# Patient Record
Sex: Female | Born: 1940 | Race: White | Hispanic: No | Marital: Married | Smoking: Former smoker
Health system: Southern US, Community
[De-identification: ages and names within clinical notes are randomized; demographics above are authoritative.]

## PROBLEM LIST (undated history)

## (undated) DIAGNOSIS — E039 Hypothyroidism, unspecified: Secondary | ICD-10-CM

## (undated) DIAGNOSIS — K219 Gastro-esophageal reflux disease without esophagitis: Secondary | ICD-10-CM

## (undated) DIAGNOSIS — K589 Irritable bowel syndrome without diarrhea: Secondary | ICD-10-CM

## (undated) DIAGNOSIS — Z9889 Other specified postprocedural states: Secondary | ICD-10-CM

## (undated) DIAGNOSIS — M797 Fibromyalgia: Secondary | ICD-10-CM

## (undated) DIAGNOSIS — G5600 Carpal tunnel syndrome, unspecified upper limb: Secondary | ICD-10-CM

## (undated) DIAGNOSIS — H3530 Unspecified macular degeneration: Secondary | ICD-10-CM

## (undated) DIAGNOSIS — K5790 Diverticulosis of intestine, part unspecified, without perforation or abscess without bleeding: Secondary | ICD-10-CM

## (undated) DIAGNOSIS — R112 Nausea with vomiting, unspecified: Secondary | ICD-10-CM

## (undated) DIAGNOSIS — H00019 Hordeolum externum unspecified eye, unspecified eyelid: Secondary | ICD-10-CM

## (undated) DIAGNOSIS — N6019 Diffuse cystic mastopathy of unspecified breast: Secondary | ICD-10-CM

## (undated) DIAGNOSIS — I10 Essential (primary) hypertension: Secondary | ICD-10-CM

## (undated) DIAGNOSIS — IMO0001 Reserved for inherently not codable concepts without codable children: Secondary | ICD-10-CM

## (undated) DIAGNOSIS — G43909 Migraine, unspecified, not intractable, without status migrainosus: Secondary | ICD-10-CM

## (undated) HISTORY — DX: Reserved for inherently not codable concepts without codable children: IMO0001

## (undated) HISTORY — PX: APPENDECTOMY: SHX54

## (undated) HISTORY — PX: REPLACEMENT TOTAL KNEE: SUR1224

## (undated) HISTORY — DX: Hypothyroidism, unspecified: E03.9

## (undated) HISTORY — DX: Carpal tunnel syndrome, unspecified upper limb: G56.00

## (undated) HISTORY — DX: Fibromyalgia: M79.7

## (undated) HISTORY — PX: OTHER SURGICAL HISTORY: SHX169

## (undated) HISTORY — PX: CHOLECYSTECTOMY: SHX55

## (undated) HISTORY — DX: Essential (primary) hypertension: I10

## (undated) HISTORY — DX: Diverticulosis of intestine, part unspecified, without perforation or abscess without bleeding: K57.90

## (undated) HISTORY — DX: Migraine, unspecified, not intractable, without status migrainosus: G43.909

## (undated) HISTORY — DX: Unspecified macular degeneration: H35.30

## (undated) HISTORY — DX: Irritable bowel syndrome without diarrhea: K58.9

## (undated) HISTORY — DX: Gastro-esophageal reflux disease without esophagitis: K21.9

## (undated) HISTORY — DX: Diffuse cystic mastopathy of unspecified breast: N60.19

---

## 1998-02-05 ENCOUNTER — Ambulatory Visit (HOSPITAL_COMMUNITY): Admission: RE | Admit: 1998-02-05 | Discharge: 1998-02-05 | Payer: Self-pay

## 1998-06-03 ENCOUNTER — Other Ambulatory Visit: Admission: RE | Admit: 1998-06-03 | Discharge: 1998-06-03 | Payer: Self-pay | Admitting: Obstetrics and Gynecology

## 1998-09-29 ENCOUNTER — Ambulatory Visit (HOSPITAL_COMMUNITY): Admission: RE | Admit: 1998-09-29 | Discharge: 1998-09-29 | Payer: Self-pay | Admitting: Gastroenterology

## 1999-12-02 ENCOUNTER — Encounter: Admission: RE | Admit: 1999-12-02 | Discharge: 1999-12-02 | Payer: Self-pay | Admitting: Gastroenterology

## 1999-12-02 ENCOUNTER — Encounter: Payer: Self-pay | Admitting: Gastroenterology

## 2000-11-16 ENCOUNTER — Encounter: Admission: RE | Admit: 2000-11-16 | Discharge: 2000-11-16 | Payer: Self-pay | Admitting: Gastroenterology

## 2000-11-16 ENCOUNTER — Encounter: Payer: Self-pay | Admitting: Gastroenterology

## 2001-11-19 ENCOUNTER — Encounter: Payer: Self-pay | Admitting: Gastroenterology

## 2001-11-19 ENCOUNTER — Encounter: Admission: RE | Admit: 2001-11-19 | Discharge: 2001-11-19 | Payer: Self-pay | Admitting: Gastroenterology

## 2004-01-27 ENCOUNTER — Ambulatory Visit (HOSPITAL_COMMUNITY): Admission: RE | Admit: 2004-01-27 | Discharge: 2004-01-27 | Payer: Self-pay | Admitting: Gastroenterology

## 2006-10-16 ENCOUNTER — Inpatient Hospital Stay (HOSPITAL_COMMUNITY): Admission: RE | Admit: 2006-10-16 | Discharge: 2006-10-18 | Payer: Self-pay | Admitting: Orthopedic Surgery

## 2006-11-23 ENCOUNTER — Emergency Department (HOSPITAL_COMMUNITY): Admission: EM | Admit: 2006-11-23 | Discharge: 2006-11-23 | Payer: Self-pay | Admitting: Emergency Medicine

## 2008-03-13 ENCOUNTER — Encounter: Admission: RE | Admit: 2008-03-13 | Discharge: 2008-03-13 | Payer: Self-pay | Admitting: Gastroenterology

## 2011-02-11 NOTE — Op Note (Signed)
Carol Powers, Carol Powers            ACCOUNT NO.:  [id number]   MEDICAL RECORD NO.:  [id number]          PATIENT TYPE:  INP   LOCATION:  2899                         FACILITY:  [facility]   PHYSICIAN:  Elana Alm. Thurston Hole, M.D. DATE OF BIRTH:  03-Aug-1941   DATE OF PROCEDURE:  10/16/2006  DATE OF DISCHARGE:                               OPERATIVE REPORT   PREOPERATIVE DIAGNOSIS:  Right knee DJD.   POSTOPERATIVE DIAGNOSIS:  Right knee DJD.   PROCEDURE:  Right total knee replacement using DePuy cemented total knee  system with #2.5 cemented femur, #3 cemented tibia with 12.5 mm  polyethylene RP tibial spacer and 32 mm polyethylene cemented patella.   SURGEON:  Elana Alm. Thurston Hole, M.D.   ASSISTANT:  Julien Girt, P.A.   ANESTHESIA:  General.   OPERATIVE TIME:  1 hour 15 minutes.   COMPLICATIONS:  None.   DESCRIPTION OF PROCEDURE:  Carol Powers was brought to operating room  on October 16, 2006 after a femoral nerve block was placed in holding by  anesthesia.  She was placed operative table supine position.  She  received Ancef 1 gram IV preoperatively for prophylaxis.  After being  placed under general anesthesia.  Right knee was examined.  Range of  motion from minus 7-120 degrees with mild varus deformity. Knee stable  ligamentous exam with normal patellar tracking.  She had a Foley  catheter placed under sterile conditions.  Her right leg was then  prepped using sterile DuraPrep and draped using sterile technique.  Leg  was exsanguinated and thigh tourniquet elevated 365 mm.  Initially  through a 15 cm longitudinal incision based over the patella, initial  exposure was made.  The underlying subcutaneous tissues were incised  along with skin incision.  A median arthrotomy was performed revealing  excessive amount of normal-appearing joint fluid.  The articular  surfaces were inspected.  She had grade 4 changes medially, grade 3  changes laterally, and grade 3 and 4 changes on the  patellofemoral  joint.  Osteophytes removed from the femoral condyles and tibial  plateau.  The medial lateral meniscal remnants were removed as well as  the anterior cruciate ligament.  Intramedullary drill was then drilled  up the femoral canal for placement distal femoral cutting jig which was  placed in the appropriate amount of rotation and the distal 12 mm cut  was made.  The distal femur was incised.  2.5 was found to be  appropriate size.  Number 2.5 cutting jig was placed and then these cuts  were made.  The tibial plateau was then exposed, tibial spines were  removed with an oscillating saw.  Intramedullary drill was drilled down  the tibial canal for placement of proximal tibial cutting jig which was  placed in the appropriate amount rotation and proximal 6 mm cut was made  based off the medial or lower side.  Spacer blocks were then placed,  12.5 mm blocks in flexion and extension.  This gave excellent balancing,  excellent stability, and excellent correction of her flexion and varus  deformities.  The #3 tibial base plate trial was placed  on the cut  tibial surface and the keel cut was made.  The number 2.5 distal femoral  PCL box cutter was then placed and these cuts were made.  After this was  done, #3 tibial base plate was placed and with the number 2.5 femoral  trial and a 12.5 mm polyethylene RP tibial spacer, knee was reduced,  taken through range of motion from 0-125 degrees with excellent  stability and excellent correction of her flexion and varus deformities.  The patella was incised, resurfacing 8 mm cut was made and three locking  holes placed for a 32 mm polyethylene patella.  Patellar trial was  placed.  Patellofemoral tracking was evaluated and this was found to be  normal.  At this point it was felt that all components were excellent  size, fit and stability.  They were then removed and the knee was then  jet lavage irrigated with 3 liters of saline.  #3 tibial  baseplate with  cement backing was hammered in position with an excellent fit with  excess cement being removed from around the edges.  Number 2.5 femoral  component with cement backing was hammered into position also with an  excellent fit.  The 12.5 mm polyethylene RP tibial spacer was placed on  tibial baseplate.  The knee reduced, taken through range of motion from  0-125 degrees with excellent stability and excellent correction of her  flexion and varus deformities.  The 32 mm polyethylene cement backed  patella was then placed in its position and held there with a clamp.  After the cement hardened, patellofemoral tracking was again evaluated.  This was found to be normal.  At this point ir was felt that all  components were excellent size, fit and stability.  The wound was  further irrigated.  Tourniquet was released.  Hemostasis obtained with  cautery.  The arthrotomy was closed with #1 Ethibond suture over two  medium Hemovac drains.  Subcutaneous tissues closed with 0 and 2-0  Vicryl, subcuticular layer closed with 4-0 Monocryl.  Sterile dressings  and a long-leg splint applied.  The patient awakened, extubated, taken  to recovery in stable condition.  Needle, sponge counts correct x2 at  end of the case.      Robert A. Thurston Hole, M.D.  Electronically Signed     RAW/MEDQ  D:  10/16/2006  T:  10/16/2006  Job:  [id number]

## 2011-02-11 NOTE — Discharge Summary (Signed)
Carol Powers, Carol Powers            ACCOUNT NO.:  [id number]   MEDICAL RECORD NO.:  [id number]          PATIENT TYPE:  INP   LOCATION:  5012                         FACILITY:  [facility]   PHYSICIAN:  Elana Alm. Thurston Hole, M.D. DATE OF BIRTH:  16-Dec-1940   DATE OF ADMISSION:  10/16/2006  DATE OF DISCHARGE:  10/18/2006                               DISCHARGE SUMMARY   ADMITTING DIAGNOSES:  1. End-stage degenerative joint disease, right knee.  2. Hypertension.  3. Gastroesophageal reflux.  4. Hypothyroidism.   DISCHARGE DIAGNOSES:  1. End-stage degenerative joint disease, right knee.  2. Hypertension.  3. Gastroesophageal reflux.  4. Hypothyroidism.   HISTORY OF PRESENT ILLNESS:  The patient is a 70 year old white female  with history of end-stage DJD of her right knee.  She has failed  conservative care including anti-inflammatories, physical therapy, as  well as debriding arthroscopy.  She understands risks, benefits, and  possible complications of a right total knee replacement and is without  questions.   PROCEDURES IN HOUSE:  On October 16, 2006, the patient underwent a right  total knee replacement by Dr. Thurston Hole and a femoral nerve block by  Anesthesia.   HOSPITAL COURSE:  She was admitted postoperatively for pain control, DVT  prophylaxis, and physical therapy.  Postoperatively in the recovery  room, CPM was placed.  She tolerated CPM 0-30 degrees on the first day.  Postop day #1, hemoglobin was 11.1.  She was afebrile.  She was alert  and oriented x3.  Her drain was removed.  Her chest was clear.  Her  heart had a regular rate and rhythm.  Her lower extremity was  neurovascularly intact with brisk capillary refill and normal motor  function.  Coumadin and Lovenox were used for DVT prophylaxis.  Home  health was ordered.  Discharge planning was set for the next day.  Her  PCA was discontinued, her Foley was discontinued.  She was given a fluid  bolus.  Mepergan Fortis was added  to help with pain control.  Postop day  #2, hemoglobin was 1.1, T-max was 99.3.  She was alert and oriented.  She had no drainage from her wound.  Her calf was soft and nontender.  She tolerated CPM 0-63 degrees.  She had brisk capillary refill.  Her  sensation was intact.  She was given a Dulcolax suppository with good  results.  She was discharged to home in stable condition with TED hose  on both legs, home health PT and nursing, a CPM, a walker, a 3-in-1  commode.  Weightbearing as tolerated, on a regular diet.   DISCHARGE MEDICATIONS:  1. Mepergan fortis 1 tablet every 4 hours as needed for pain.  2. Tylenol 325 mg 2 tablets every 4 hours scheduled.  3. Coumadin 5 mg 1-1/2 tablet daily.  4. Synthroid 0.88 mg daily.  5. Aciphex 20 mg daily.  6. Lotrel 10/20 mg daily.  7. Triamterene/hydrochlorothiazide 37.5/25 mg 1 tablet daily.  8. Nortriptyline 10 mg 3 tablets at night.  9. Catapres weekly.  10.Calcium plus D 2 tablets daily.  11.Colace 100 mg 1 tablet twice a day.  If no BM in 2 days then 60 mL      of milk of magnesia.   She is weightbearing as tolerated.  Will get home health physical  therapy and nursing.  Will see her back in the office on February 4.  She will call us with increased redness, increased pain, increased  drainage, or a temperature greater than 101.      Kirstin Shepperson, P.A.      Robert A. Thurston Hole, M.D.  Electronically Signed    KS/MEDQ  D:  11/29/2006  T:  11/29/2006  Job:  [id number]

## 2011-09-29 DIAGNOSIS — M1710 Unilateral primary osteoarthritis, unspecified knee: Secondary | ICD-10-CM | POA: Diagnosis not present

## 2011-10-12 DIAGNOSIS — M549 Dorsalgia, unspecified: Secondary | ICD-10-CM | POA: Diagnosis not present

## 2011-10-12 DIAGNOSIS — B029 Zoster without complications: Secondary | ICD-10-CM | POA: Diagnosis not present

## 2011-12-14 DIAGNOSIS — Z1231 Encounter for screening mammogram for malignant neoplasm of breast: Secondary | ICD-10-CM | POA: Diagnosis not present

## 2012-01-10 DIAGNOSIS — Z79899 Other long term (current) drug therapy: Secondary | ICD-10-CM | POA: Diagnosis not present

## 2012-01-10 DIAGNOSIS — E039 Hypothyroidism, unspecified: Secondary | ICD-10-CM | POA: Diagnosis not present

## 2012-01-10 DIAGNOSIS — I10 Essential (primary) hypertension: Secondary | ICD-10-CM | POA: Diagnosis not present

## 2012-01-10 DIAGNOSIS — K219 Gastro-esophageal reflux disease without esophagitis: Secondary | ICD-10-CM | POA: Diagnosis not present

## 2012-01-10 DIAGNOSIS — Z1331 Encounter for screening for depression: Secondary | ICD-10-CM | POA: Diagnosis not present

## 2012-01-10 DIAGNOSIS — Z0000 Encounter for general adult medical examination without abnormal findings: Secondary | ICD-10-CM | POA: Diagnosis not present

## 2012-02-13 DIAGNOSIS — R5381 Other malaise: Secondary | ICD-10-CM | POA: Diagnosis not present

## 2012-02-13 DIAGNOSIS — R0789 Other chest pain: Secondary | ICD-10-CM | POA: Diagnosis not present

## 2012-02-13 DIAGNOSIS — R5383 Other fatigue: Secondary | ICD-10-CM | POA: Diagnosis not present

## 2012-03-13 ENCOUNTER — Other Ambulatory Visit: Payer: Self-pay | Admitting: Internal Medicine

## 2012-03-13 DIAGNOSIS — R220 Localized swelling, mass and lump, head: Secondary | ICD-10-CM | POA: Diagnosis not present

## 2012-03-13 DIAGNOSIS — K219 Gastro-esophageal reflux disease without esophagitis: Secondary | ICD-10-CM | POA: Diagnosis not present

## 2012-03-13 DIAGNOSIS — R221 Localized swelling, mass and lump, neck: Secondary | ICD-10-CM | POA: Diagnosis not present

## 2012-03-15 ENCOUNTER — Ambulatory Visit
Admission: RE | Admit: 2012-03-15 | Discharge: 2012-03-15 | Disposition: A | Discharge status: Home / Self Care | Payer: Medicare Other | Source: Ambulatory Visit | Attending: Internal Medicine | Admitting: Internal Medicine

## 2012-03-15 DIAGNOSIS — R221 Localized swelling, mass and lump, neck: Secondary | ICD-10-CM

## 2012-03-15 DIAGNOSIS — K119 Disease of salivary gland, unspecified: Secondary | ICD-10-CM | POA: Diagnosis not present

## 2012-03-15 DIAGNOSIS — M542 Cervicalgia: Secondary | ICD-10-CM | POA: Diagnosis not present

## 2012-03-15 DIAGNOSIS — R220 Localized swelling, mass and lump, head: Secondary | ICD-10-CM | POA: Diagnosis not present

## 2012-03-15 MED ORDER — IOHEXOL 300 MG/ML  SOLN
75.0000 mL | Freq: Once | INTRAMUSCULAR | Status: AC | PRN
  Administered 2012-03-15: 75 mL via INTRAVENOUS

## 2012-05-17 DIAGNOSIS — M25579 Pain in unspecified ankle and joints of unspecified foot: Secondary | ICD-10-CM | POA: Diagnosis not present

## 2012-05-17 DIAGNOSIS — M1710 Unilateral primary osteoarthritis, unspecified knee: Secondary | ICD-10-CM | POA: Diagnosis not present

## 2012-06-26 DIAGNOSIS — Z23 Encounter for immunization: Secondary | ICD-10-CM | POA: Diagnosis not present

## 2012-11-17 DIAGNOSIS — R110 Nausea: Secondary | ICD-10-CM | POA: Diagnosis not present

## 2012-11-17 DIAGNOSIS — R42 Dizziness and giddiness: Secondary | ICD-10-CM | POA: Diagnosis not present

## 2012-11-21 DIAGNOSIS — R42 Dizziness and giddiness: Secondary | ICD-10-CM | POA: Diagnosis not present

## 2012-12-14 DIAGNOSIS — R55 Syncope and collapse: Secondary | ICD-10-CM | POA: Diagnosis not present

## 2012-12-14 DIAGNOSIS — I10 Essential (primary) hypertension: Secondary | ICD-10-CM | POA: Diagnosis not present

## 2012-12-14 DIAGNOSIS — R002 Palpitations: Secondary | ICD-10-CM | POA: Diagnosis not present

## 2012-12-24 DIAGNOSIS — R55 Syncope and collapse: Secondary | ICD-10-CM | POA: Diagnosis not present

## 2012-12-24 DIAGNOSIS — R002 Palpitations: Secondary | ICD-10-CM | POA: Diagnosis not present

## 2013-03-05 DIAGNOSIS — E039 Hypothyroidism, unspecified: Secondary | ICD-10-CM | POA: Diagnosis not present

## 2013-03-05 DIAGNOSIS — Z0000 Encounter for general adult medical examination without abnormal findings: Secondary | ICD-10-CM | POA: Diagnosis not present

## 2013-03-05 DIAGNOSIS — Z1331 Encounter for screening for depression: Secondary | ICD-10-CM | POA: Diagnosis not present

## 2013-03-05 DIAGNOSIS — K219 Gastro-esophageal reflux disease without esophagitis: Secondary | ICD-10-CM | POA: Diagnosis not present

## 2013-03-05 DIAGNOSIS — I10 Essential (primary) hypertension: Secondary | ICD-10-CM | POA: Diagnosis not present

## 2013-03-18 DIAGNOSIS — H903 Sensorineural hearing loss, bilateral: Secondary | ICD-10-CM | POA: Diagnosis not present

## 2013-03-18 DIAGNOSIS — R42 Dizziness and giddiness: Secondary | ICD-10-CM | POA: Diagnosis not present

## 2013-04-22 DIAGNOSIS — R42 Dizziness and giddiness: Secondary | ICD-10-CM | POA: Diagnosis not present

## 2013-04-23 ENCOUNTER — Other Ambulatory Visit: Payer: Self-pay | Admitting: Internal Medicine

## 2013-04-23 DIAGNOSIS — R42 Dizziness and giddiness: Secondary | ICD-10-CM

## 2013-04-24 DIAGNOSIS — R42 Dizziness and giddiness: Secondary | ICD-10-CM | POA: Diagnosis not present

## 2013-04-27 ENCOUNTER — Ambulatory Visit
Admission: RE | Admit: 2013-04-27 | Discharge: 2013-04-27 | Disposition: A | Discharge status: Home / Self Care | Payer: Medicare Other | Source: Ambulatory Visit | Attending: Internal Medicine | Admitting: Internal Medicine

## 2013-04-27 DIAGNOSIS — R42 Dizziness and giddiness: Secondary | ICD-10-CM

## 2013-04-27 DIAGNOSIS — I771 Stricture of artery: Secondary | ICD-10-CM | POA: Diagnosis not present

## 2013-04-27 MED ORDER — GADOBENATE DIMEGLUMINE 529 MG/ML IV SOLN
14.0000 mL | Freq: Once | INTRAVENOUS | Status: AC | PRN
  Administered 2013-04-27: 14 mL via INTRAVENOUS

## 2013-05-14 DIAGNOSIS — I10 Essential (primary) hypertension: Secondary | ICD-10-CM | POA: Diagnosis not present

## 2013-05-14 DIAGNOSIS — R42 Dizziness and giddiness: Secondary | ICD-10-CM | POA: Diagnosis not present

## 2013-05-14 DIAGNOSIS — I679 Cerebrovascular disease, unspecified: Secondary | ICD-10-CM | POA: Diagnosis not present

## 2013-06-04 ENCOUNTER — Ambulatory Visit (INDEPENDENT_AMBULATORY_CARE_PROVIDER_SITE_OTHER): Payer: Medicare Other | Admitting: Neurology

## 2013-06-04 ENCOUNTER — Encounter: Payer: Self-pay | Admitting: Neurology

## 2013-06-04 VITALS — BP 134/78 | HR 99 | Temp 98.9°F | Ht 60.5 in | Wt 155.0 lb

## 2013-06-04 DIAGNOSIS — R42 Dizziness and giddiness: Secondary | ICD-10-CM

## 2013-06-04 DIAGNOSIS — I679 Cerebrovascular disease, unspecified: Secondary | ICD-10-CM

## 2013-06-04 DIAGNOSIS — G250 Essential tremor: Secondary | ICD-10-CM

## 2013-06-04 NOTE — Patient Instructions (Addendum)
I think overall you are doing fairly well but I do want to suggest a few things today:  Remember to drink plenty of fluid, eat healthy meals and do not skip any meals. Try to eat protein with a every meal and eat a healthy snack such as fruit or nuts in between meals. Try to keep a regular sleep-wake schedule and try to exercise daily, particularly in the form of walking, 20-30 minutes a day, if you can.   As far as your medications are concerned, I would like to suggest as needed use of Meclizine.   As far as diagnostic testing: no new test at this time.   Cardiovascular risk factor management is key. Please ask family and friends or your significant other or bed partner if you snore and if so, how loud it is, and if you have breathing related issues in your sleep, such as: snorting sounds, choking sounds, pauses in your breathing or shallow breathing events. These may be symptoms of obstructive sleep apnea (OSA).  Stay well hydrated at all times. Change head position not abruptly.   Consider physical therapy (vestibular rehab) if symptoms seem to get worse, longer or more frequent.

## 2013-06-04 NOTE — Progress Notes (Signed)
Subjective:    Patient ID: Carol Powers is a 72 y.o. female.  HPI  Huston Foley, MD, PhD [facility] [address] [address] [place], [place] [address]  Dear Dr. Earl Gala,  I saw your patient, Carol Powers, upon your kind request in my neurologic clinic today for initial consultation of her intermittent dizziness/vertigo spells. The patient is unaccompanied today. As you know, Carol Powers is a very pleasant 72 year old right-handed woman with an underlying medical history of migraines, reflux disease, hypertension, hypothyroidism, irritable bowel syndrome, fibromyalgia, carpal tunnel syndrome, diverticulosis, and arthritis, who has had about 5 or 6 spells of dizziness since last year. She reports a sudden onset of spinning sensation and no warning signs. Her vertigo symptoms may last up to 2 hours but are typically in the 15-30 minute range. She had an MRI and MRA of the brain and MRA of the neck. I reviewed her MRI reports. Her neck MRA showed mild tortuosity of the cervical ICA bilaterally. No significant stenosis was seen. 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 She quit smoking years ago, she drinks EtOH very rarely. Her father had a MI. A MA has vertigo at times, but she is in her 33s. Her mother had a head tremor and died at 17 from kidney cancer. There is cancer on the maternal side. She has 2 daughters, both have migraines. She has a personal hx of migraines, which improved post-menopause. Never had TIA or stroke symptoms, denying sudden onset of one sided weakness, numbness, tingling, slurring of speech or droopy face, hearing loss, diplopia or visual field cut or monocular loss of vision, and denies recurrent headaches. She has a Hx of tinnitus, but this is mild and not new. She has been on ASA 81 mg for the last month.  She reports occasional snoring per husband's feedback. He has never reported any apneas to her. She does not wake up gasping or choking. She was given meclizine when she went to the emergency room one time. She felt it was somewhat helpful.  Her Past Medical History Is Significant For: Past Medical History  Diagnosis Date  . Migraine   . Reflux   . HTN (hypertension)   . Hypothyroidism   . IBS (irritable bowel syndrome)   . Fibromyalgia   . Carpal tunnel syndrome   . Diverticulosis   . Fibrocystic breast disease     Her Past Surgical History Is Significant For: Past Surgical History  Procedure Laterality Date  . Total knee arthroplasty Left   . Replacement total knee Right     Her Family History Is Significant For: Family History  Problem Relation Age of Onset  . Cancer Mother   . Heart failure Father     Her Social History Is Significant For: History   Social History  . Marital Status: Married    Spouse Name: N/A    Number of Children: N/A  . Years of Education: N/A   Social History Main Topics  . Smoking status: Former Smoker    Types: Cigarettes    Quit date: 06/05/1967  . Smokeless tobacco: None  . Alcohol Use: 1.0 oz/week    2 drink(s) per week  . Drug Use: No  . Sexual Activity: None   Other Topics Concern  . None   Social History Narrative  . None    Her  Allergies Are:  Allergies  Allergen Reactions  . Beta Adrenergic Blockers Other (See Comments)    Nightmares  . Codeine   . Dexilant [Dexlansoprazole]   . Erythromycin   . Septra [Sulfamethoxazole W-Trimethoprim]   :   Her Current Medications Are:  Outpatient Encounter Prescriptions as of 06/04/2013  Medication Sig Dispense Refill  . amLODipine (NORVASC) 10 MG tablet Take 10 mg by mouth daily.      Marland Kitchen aspirin 81 MG tablet Take 81 mg by mouth daily.      . benazepril (LOTENSIN) 40 MG tablet Take 40 mg by mouth daily.      . Calcium Carbonate-Vitamin D (CALCIUM-VITAMIN D) 500-200 MG-UNIT per  tablet Take 1 tablet by mouth 2 (two) times daily with a meal.      . esomeprazole (NEXIUM) 40 MG capsule Take 40 mg by mouth daily before breakfast.      . fish oil-omega-3 fatty acids 1000 MG capsule Take 2 g by mouth daily.      Marland Kitchen levothyroxine (SYNTHROID, LEVOTHROID) 88 MCG tablet Take 88 mcg by mouth daily before breakfast.      . loratadine (CLARITIN) 10 MG tablet Take 10 mg by mouth daily.      . nortriptyline (PAMELOR) 10 MG capsule       . triamterene-hydrochlorothiazide (MAXZIDE-25) 37.5-25 MG per tablet Take 1 tablet by mouth every morning.      . vitamin C (ASCORBIC ACID) 500 MG tablet Take 500 mg by mouth daily.       No facility-administered encounter medications on file as of 06/04/2013.   Review of Systems  HENT: Positive for tinnitus.   Musculoskeletal: Positive for arthralgias.  Neurological: Positive for dizziness.    Objective:  Neurologic Exam  Physical Exam Physical Examination:   Filed Vitals:   06/04/13 0833  BP: 134/78  Pulse: 99  Temp: 98.9 F (37.2 C)    General Examination: The patient is a very pleasant 72 y.o. female in no acute distress. She appears well-developed and well-nourished and well groomed. She is overweight.  HEENT: Normocephalic, atraumatic, pupils are equal, round and reactive to light and accommodation. Extraocular tracking is good without limitation to gaze excursion or nystagmus noted. Normal smooth pursuit is noted. Hearing is grossly intact. Tympanic membranes are clear bilaterally. Face is symmetric with normal facial animation and normal facial sensation. Speech is clear with no dysarthria noted. There is no hypophonia. There is a very mild intermittent yes-yes type head tremor. Neck is supple with full range of passive and active motion. There are no carotid bruits on auscultation. Oropharynx exam reveals: mild mouth dryness, adequate dental hygiene and mild airway crowding, due to redundant soft palated. Mallampati is class II.  Tongue protrudes centrally and palate elevates symmetrically. She has no sudden onset of lightheadedness or vertigo upon standing. She has no vertiginous symptoms or nystagmus upon sudden changes of her head position when moved actively or passively.  Chest: Clear to auscultation without wheezing, rhonchi or crackles noted.  Heart: S1+S2+0, regular and normal without murmurs, rubs or gallops noted.   Abdomen: Soft, non-tender and non-distended with normal bowel sounds appreciated on auscultation.  Extremities: There is no pitting edema in the distal lower extremities bilaterally. Pedal pulses are intact.  Skin: Warm and dry without trophic changes noted. There are no varicose veins.  Musculoskeletal: exam reveals no obvious joint deformities, tenderness or joint swelling or erythema.   Neurologically:  Mental status: The patient is awake, alert and oriented in  all 4 spheres. Her memory, attention, language and knowledge are appropriate. There is no aphasia, agnosia, apraxia or anomia. Speech is clear with normal prosody and enunciation. Thought process is linear. Mood is congruent and affect is normal.  Cranial nerves are as described above under HEENT exam. In addition, shoulder shrug is normal with equal shoulder height noted. Motor exam: Normal bulk, strength and tone is noted. There is no drift, tremor or rebound. Romberg is negative. Reflexes are 2+ throughout. Toes are downgoing bilaterally. Fine motor skills are intact with normal finger taps, normal hand movements, normal rapid alternating patting, normal foot taps and normal foot agility.  Cerebellar testing shows no dysmetria or intention tremor on finger to nose testing. Heel to shin is unremarkable bilaterally. There is no truncal or gait ataxia.  Sensory exam is intact to light touch, pinprick, vibration, temperature sense and proprioception in the upper and lower extremities.  Gait, station and balance are unremarkable. No veering  to one side is noted. No leaning to one side is noted. Posture is age-appropriate and stance is narrow based. No problems turning are noted. She turns en bloc. Tandem walk is unremarkable. Intact toe and heel stance is noted.               Assessment and Plan:   Assessment and Plan:  In summary, Carol Powers is a very pleasant 72 y.o.-year old female with a history of paroxysmal vertigo, infrequent and usually brief. I reviewed her recent MRI and MRA reports with her. 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 Her symptoms and her physical exam are more in keeping with benign paroxysmal positional vertigo. I talked to her at length about this. I also reassured her that her physical exam is nonfocal today. If her symptoms were to come more frequently or last longer she may be a good candidate for physical therapy for vestibular rehabilitation. At this time she is advised that cardiovascular risk factor modification and management are probably the most important factors. This means blood pressure management, thyroid function test, screening for diabetes, cholesterol management and screening for obstructive sleep apnea and weight management. She's also advised that branch intracranial blood vessel stenosis is not reversible. I would agree with the baby aspirin and would like for her to continue with that. She can also continue to use meclizine as needed. She is advised to stay well-hydrated. She is advised to try to change positions not abruptly. She's also advised that paroxysmal vertigo can come back at any time. At this juncture I will not add any more testing. I do not see any recent laboratory data other than a BMP in your records. She indicates that she recently had blood work through your office. I suggested that I see her back on an as-needed basis. 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 She has a family history of essential tremor in her mother.  Thank you very much for allowing me to participate in the care of this nice patient. If I can be of any further assistance to you please do not hesitate to call me at [phone].  Sincerely,   Huston Foley, MD, PhD

## 2013-06-27 DIAGNOSIS — H18419 Arcus senilis, unspecified eye: Secondary | ICD-10-CM | POA: Diagnosis not present

## 2013-06-27 DIAGNOSIS — H5231 Anisometropia: Secondary | ICD-10-CM | POA: Diagnosis not present

## 2013-06-27 DIAGNOSIS — H04129 Dry eye syndrome of unspecified lacrimal gland: Secondary | ICD-10-CM | POA: Diagnosis not present

## 2013-06-27 DIAGNOSIS — H353190 Nonexudative age-related macular degeneration, unspecified eye, stage unspecified: Secondary | ICD-10-CM | POA: Diagnosis not present

## 2013-07-10 DIAGNOSIS — Z23 Encounter for immunization: Secondary | ICD-10-CM | POA: Diagnosis not present

## 2013-08-01 DIAGNOSIS — Z1231 Encounter for screening mammogram for malignant neoplasm of breast: Secondary | ICD-10-CM | POA: Diagnosis not present

## 2013-08-20 DIAGNOSIS — N390 Urinary tract infection, site not specified: Secondary | ICD-10-CM | POA: Diagnosis not present

## 2014-02-05 DIAGNOSIS — R198 Other specified symptoms and signs involving the digestive system and abdomen: Secondary | ICD-10-CM | POA: Diagnosis not present

## 2014-02-05 DIAGNOSIS — K921 Melena: Secondary | ICD-10-CM | POA: Diagnosis not present

## 2014-02-05 DIAGNOSIS — M545 Low back pain, unspecified: Secondary | ICD-10-CM | POA: Diagnosis not present

## 2014-02-19 IMAGING — CT CT NECK W/ CM
4 of 5 series · 16 of 33 positions shown, 19 images · IV contrast (75CC OMNI 300)
Comparison: Chest CT 03/13/2008 and earlier.

CLINICAL DATA: 70-year-old female with right neck pain.  History of
benign parotid mass resected in 9445, right thyroidectomy for
benign disease in 8990.

CT NECK WITH CONTRAST
TECHNIQUE: Multidetector CT imaging of the neck was performed with
intravenous contrast.
Contrast: 75mL OMNIPAQUE IOHEXOL 300 MG/ML  SOLN

[Series 4: axial neck · axial · 0.39mm/px · z∈[-213,-116]mm · 3 of 99 slices shown]
[im 20/99  bone]
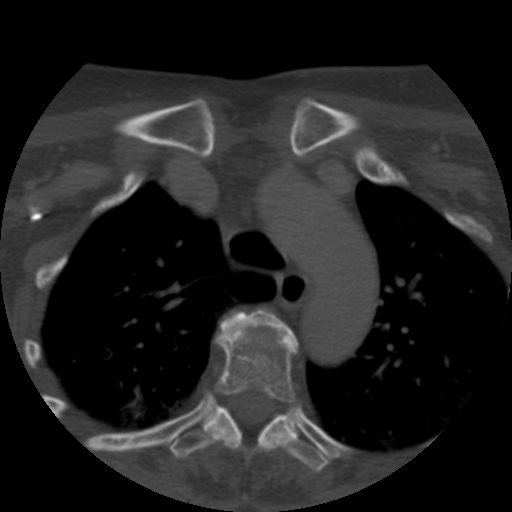
[im 40/99  bone]
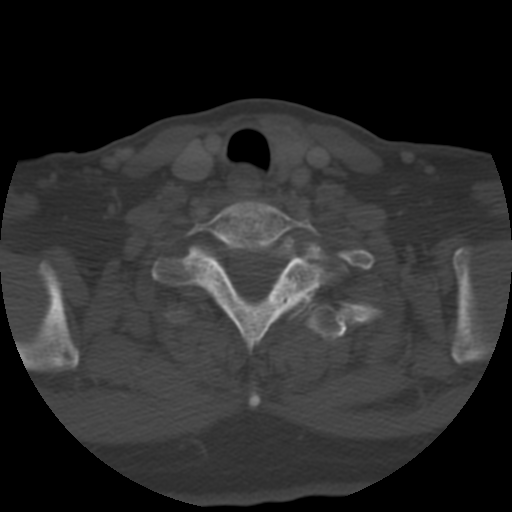
[im 59/99  bone]
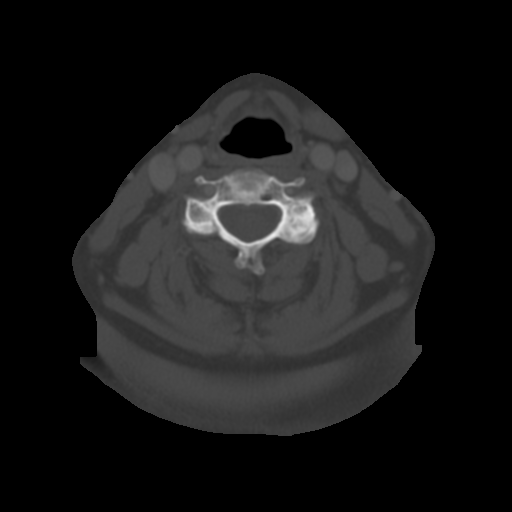

[Series 300: cor · coronal · 0.49mm/px · 3 of 100 slices shown]
[im 20/100  bone]
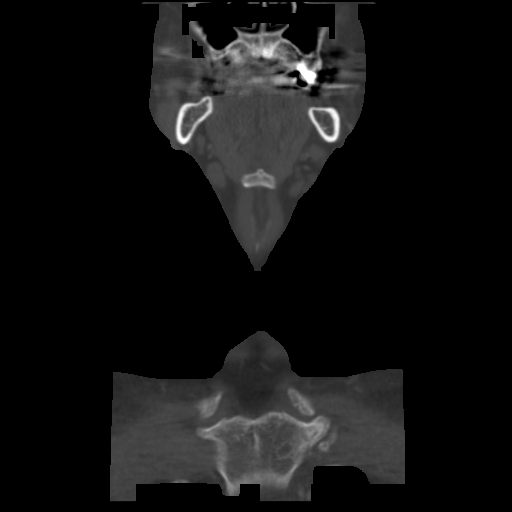
[im 40/100  bone]
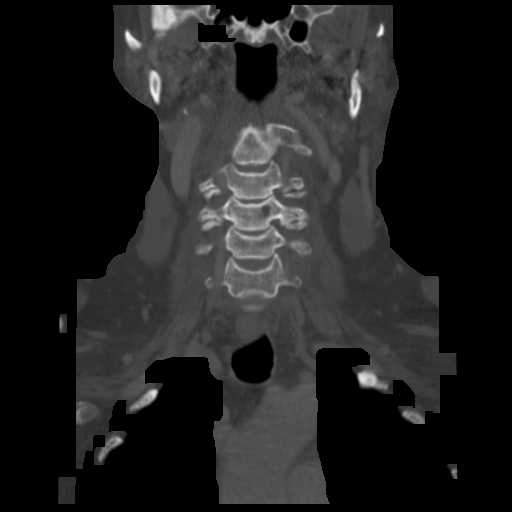
[im 60/100  bone]
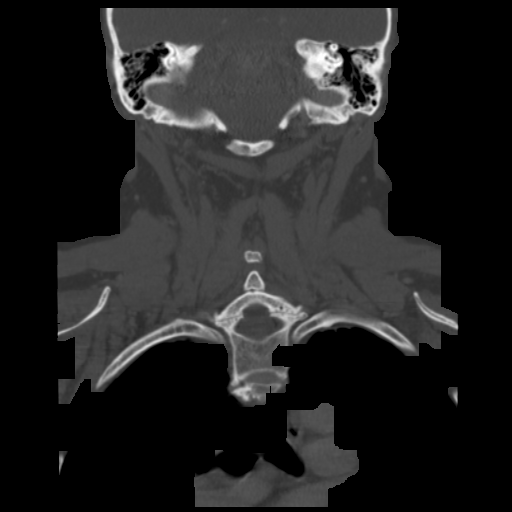

[Series 301: sag · sagittal · 0.49mm/px · 5 of 100 slices shown, 6 images]
[im 34/100  bone]
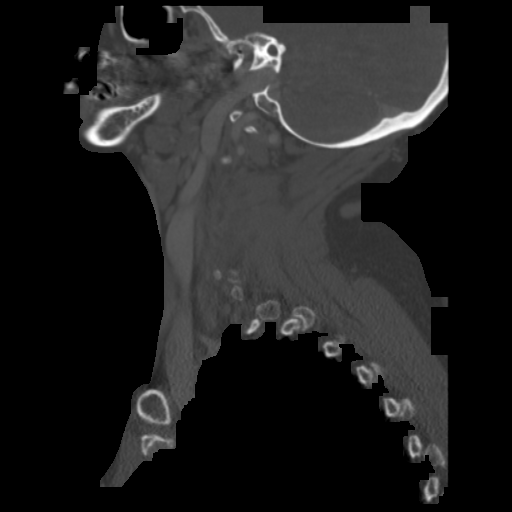
[im 42/100  bone]
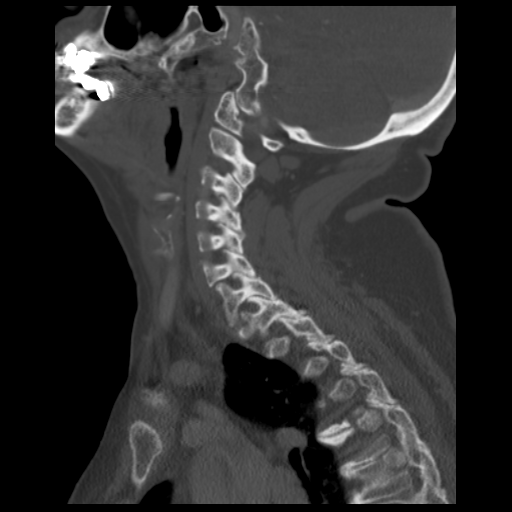
[im 50/100  soft-tissue]
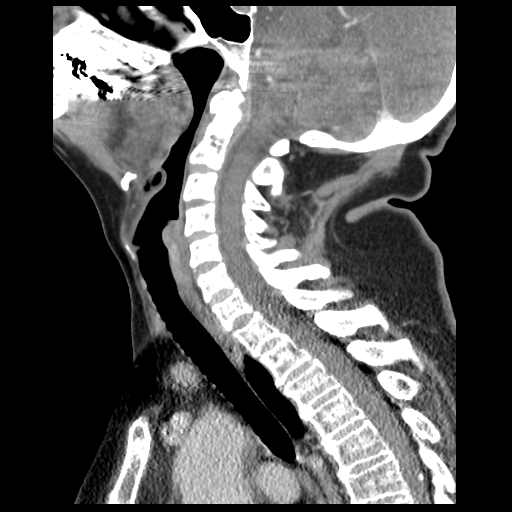
[im 50/100  bone]
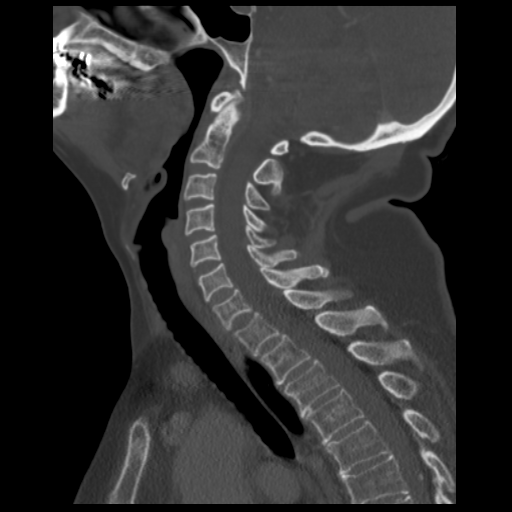
[im 58/100  bone]
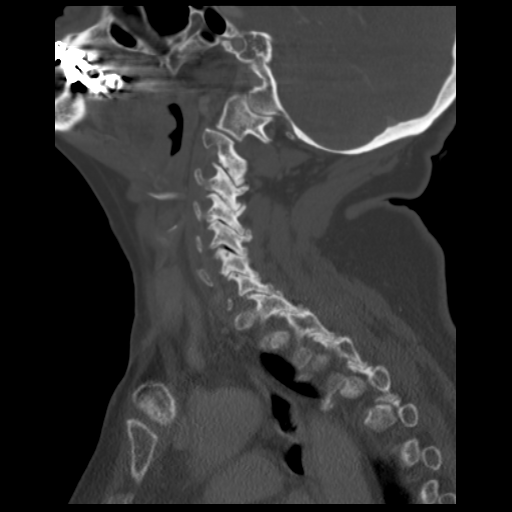
[im 67/100  bone]
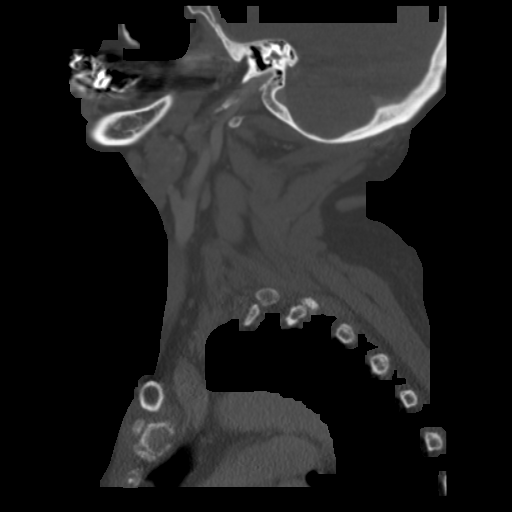

[Series 302: axial · axial · 0.39mm/px · z∈[-231,-64]mm · 5 of 126 slices shown, 7 images]
[im 21/126  soft-tissue]
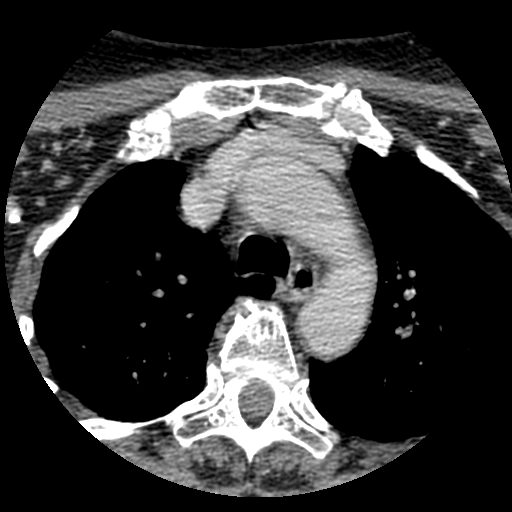
[im 21/126  bone]
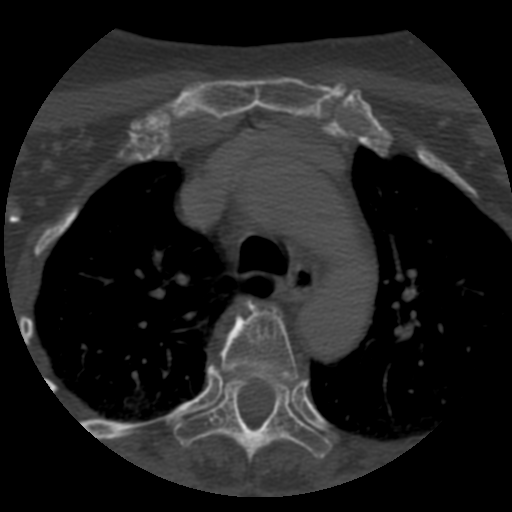
[im 42/126  bone]
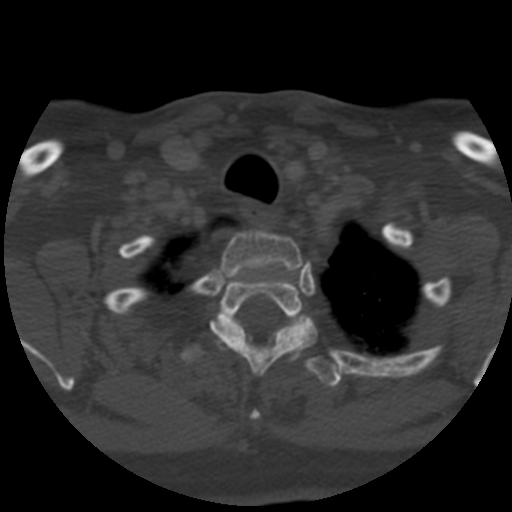
[im 63/126  bone]
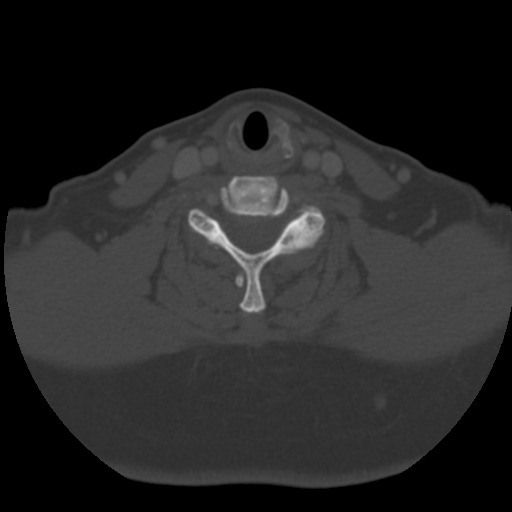
[im 84/126  bone]
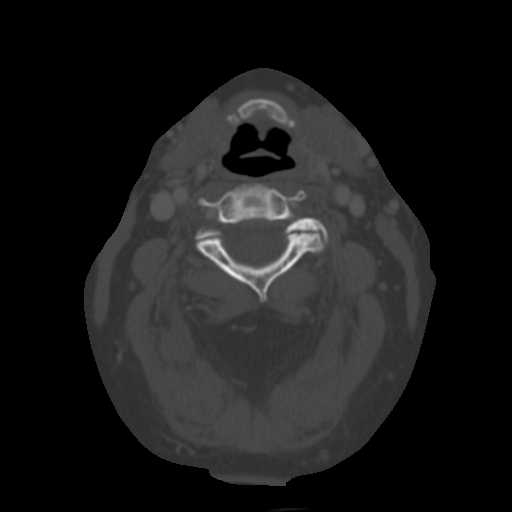
[im 105/126  soft-tissue]
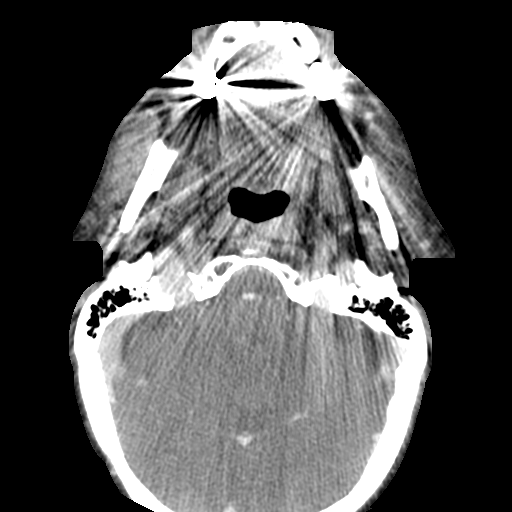
[im 105/126  bone]
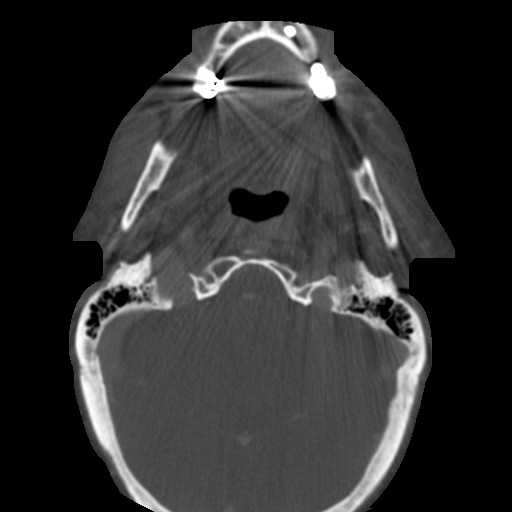

[16 of 33 positions shown; findings below may reference images not displayed]

FINDINGS: Atelectasis in the lung apices.  Stable and negative
visualized superior mediastinum.

The right thyroid lobe is surgically absent.  The left lobe is
within normal limits.  Negative larynx.  Pharyngeal mucosal
surfaces are within normal limits.  Parapharyngeal and
retropharyngeal spaces are within normal limits.  Sublingual space
within normal limits.

The area of the palpable abnormality is marked at the right neck on
series 4 image 28 corresponding to the right parotidectomy surgical
bed posterior to the angle of the mandible.  No underlying mass or
lymphadenopathy.  Submandibular glands are within normal limits.
No abnormal cervical lymph nodes are identified.

Major vascular structures in the neck are patent including the
right internal jugular vein.  Grossly negative visualized brain
parenchyma.

Visualized paranasal sinuses and mastoids are clear.  Facet
degeneration in the cervical spine and upper thoracic spine. No
acute osseous abnormality identified.   There is a small chronic
punctate calcification along the posterior thecal sac at the T6-T7
level, unchanged since 11/23/2006.
IMPRESSION: Postoperative changes from resection of the right parotid and
thyroid lobe.  No mass, lymphadenopathy, or acute findings in the
neck.

## 2014-02-25 ENCOUNTER — Telehealth: Payer: Self-pay | Admitting: Hematology & Oncology

## 2014-02-25 NOTE — Telephone Encounter (Signed)
na

## 2014-03-03 ENCOUNTER — Other Ambulatory Visit: Payer: Self-pay | Admitting: Gastroenterology

## 2014-03-03 DIAGNOSIS — K921 Melena: Secondary | ICD-10-CM | POA: Diagnosis not present

## 2014-03-11 DIAGNOSIS — Z23 Encounter for immunization: Secondary | ICD-10-CM | POA: Diagnosis not present

## 2014-03-11 DIAGNOSIS — E782 Mixed hyperlipidemia: Secondary | ICD-10-CM | POA: Diagnosis not present

## 2014-03-11 DIAGNOSIS — Z0000 Encounter for general adult medical examination without abnormal findings: Secondary | ICD-10-CM | POA: Diagnosis not present

## 2014-03-11 DIAGNOSIS — E039 Hypothyroidism, unspecified: Secondary | ICD-10-CM | POA: Diagnosis not present

## 2014-03-11 DIAGNOSIS — I10 Essential (primary) hypertension: Secondary | ICD-10-CM | POA: Diagnosis not present

## 2014-03-11 DIAGNOSIS — K219 Gastro-esophageal reflux disease without esophagitis: Secondary | ICD-10-CM | POA: Diagnosis not present

## 2014-03-19 DIAGNOSIS — L819 Disorder of pigmentation, unspecified: Secondary | ICD-10-CM | POA: Diagnosis not present

## 2014-03-19 DIAGNOSIS — D1801 Hemangioma of skin and subcutaneous tissue: Secondary | ICD-10-CM | POA: Diagnosis not present

## 2014-03-19 DIAGNOSIS — D239 Other benign neoplasm of skin, unspecified: Secondary | ICD-10-CM | POA: Diagnosis not present

## 2014-03-19 DIAGNOSIS — L723 Sebaceous cyst: Secondary | ICD-10-CM | POA: Diagnosis not present

## 2014-03-19 DIAGNOSIS — L739 Follicular disorder, unspecified: Secondary | ICD-10-CM | POA: Diagnosis not present

## 2014-03-19 DIAGNOSIS — L821 Other seborrheic keratosis: Secondary | ICD-10-CM | POA: Diagnosis not present

## 2014-04-21 ENCOUNTER — Encounter (HOSPITAL_COMMUNITY): Payer: Self-pay | Admitting: Pharmacy Technician

## 2014-04-30 ENCOUNTER — Encounter (HOSPITAL_COMMUNITY): Payer: Self-pay | Admitting: *Deleted

## 2014-05-01 DIAGNOSIS — H353190 Nonexudative age-related macular degeneration, unspecified eye, stage unspecified: Secondary | ICD-10-CM | POA: Diagnosis not present

## 2014-05-01 DIAGNOSIS — H40019 Open angle with borderline findings, low risk, unspecified eye: Secondary | ICD-10-CM | POA: Diagnosis not present

## 2014-05-01 DIAGNOSIS — H04129 Dry eye syndrome of unspecified lacrimal gland: Secondary | ICD-10-CM | POA: Diagnosis not present

## 2014-05-01 DIAGNOSIS — H00019 Hordeolum externum unspecified eye, unspecified eyelid: Secondary | ICD-10-CM | POA: Diagnosis not present

## 2014-05-13 ENCOUNTER — Ambulatory Visit (HOSPITAL_COMMUNITY): Payer: Medicare Other | Admitting: Anesthesiology

## 2014-05-13 ENCOUNTER — Ambulatory Visit (HOSPITAL_COMMUNITY)
Admission: RE | Admit: 2014-05-13 | Discharge: 2014-05-13 | Disposition: A | Discharge status: Home / Self Care | Payer: Medicare Other | Source: Ambulatory Visit | Attending: Gastroenterology | Admitting: Gastroenterology

## 2014-05-13 ENCOUNTER — Encounter (HOSPITAL_COMMUNITY): Payer: Medicare Other | Admitting: Anesthesiology

## 2014-05-13 ENCOUNTER — Encounter (HOSPITAL_COMMUNITY): Payer: Self-pay | Admitting: *Deleted

## 2014-05-13 ENCOUNTER — Encounter (HOSPITAL_COMMUNITY)
Admission: RE | Disposition: A | Discharge status: Home / Self Care | Payer: Self-pay | Source: Ambulatory Visit | Attending: Gastroenterology

## 2014-05-13 DIAGNOSIS — Z87891 Personal history of nicotine dependence: Secondary | ICD-10-CM | POA: Diagnosis not present

## 2014-05-13 DIAGNOSIS — E039 Hypothyroidism, unspecified: Secondary | ICD-10-CM | POA: Diagnosis not present

## 2014-05-13 DIAGNOSIS — Z79899 Other long term (current) drug therapy: Secondary | ICD-10-CM | POA: Diagnosis not present

## 2014-05-13 DIAGNOSIS — Z96659 Presence of unspecified artificial knee joint: Secondary | ICD-10-CM | POA: Diagnosis not present

## 2014-05-13 DIAGNOSIS — IMO0001 Reserved for inherently not codable concepts without codable children: Secondary | ICD-10-CM | POA: Diagnosis not present

## 2014-05-13 DIAGNOSIS — K921 Melena: Secondary | ICD-10-CM | POA: Insufficient documentation

## 2014-05-13 DIAGNOSIS — Z7982 Long term (current) use of aspirin: Secondary | ICD-10-CM | POA: Diagnosis not present

## 2014-05-13 DIAGNOSIS — K589 Irritable bowel syndrome without diarrhea: Secondary | ICD-10-CM | POA: Diagnosis not present

## 2014-05-13 DIAGNOSIS — I10 Essential (primary) hypertension: Secondary | ICD-10-CM | POA: Diagnosis not present

## 2014-05-13 HISTORY — DX: Hordeolum externum unspecified eye, unspecified eyelid: H00.019

## 2014-05-13 HISTORY — DX: Other specified postprocedural states: Z98.890

## 2014-05-13 HISTORY — PX: COLONOSCOPY WITH PROPOFOL: SHX5780

## 2014-05-13 HISTORY — DX: Other specified postprocedural states: R11.2

## 2014-05-13 SURGERY — COLONOSCOPY WITH PROPOFOL
Anesthesia: Monitor Anesthesia Care

## 2014-05-13 MED ORDER — LACTATED RINGERS IV SOLN
INTRAVENOUS | Status: DC
  Administered 2014-05-13: 11:00:00 via INTRAVENOUS

## 2014-05-13 MED ORDER — LIDOCAINE HCL (CARDIAC) 20 MG/ML IV SOLN
INTRAVENOUS | Status: AC
  Filled 2014-05-13: qty 5

## 2014-05-13 MED ORDER — PROPOFOL 10 MG/ML IV BOLUS
INTRAVENOUS | Status: DC | PRN
  Administered 2014-05-13 (×4): 20 mg via INTRAVENOUS

## 2014-05-13 MED ORDER — PROPOFOL INFUSION 10 MG/ML OPTIME
INTRAVENOUS | Status: DC | PRN
  Administered 2014-05-13: 120 ug/kg/min via INTRAVENOUS

## 2014-05-13 MED ORDER — LIDOCAINE HCL (CARDIAC) 20 MG/ML IV SOLN
INTRAVENOUS | Status: DC | PRN
  Administered 2014-05-13: 100 mg via INTRAVENOUS

## 2014-05-13 MED ORDER — PROPOFOL 10 MG/ML IV BOLUS
INTRAVENOUS | Status: AC
  Filled 2014-05-13: qty 20

## 2014-05-13 SURGICAL SUPPLY — 22 items

## 2014-05-13 NOTE — H&P (Signed)
  Problem: Intermittent hematochezia. Normal screening colonoscopy performed on 12/03/2008  History: The patient is a 73 year old female born in November 16, 1940. She underwent a normal screening colonoscopy in March 2010. Intermittently, she sees fresh blood on the toilet tissue after a bowel movement.  Her mother was diagnosed with kidney cancer. A maternal aunt was diagnosed with liver cancer. Her maternal grandmother was diagnosed with colon cancer. Her maternal grandfather was diagnosed with kidney cancer. Her brother and her 2 daughters have not been diagnosed with cancer.  The patient is scheduled to undergo a diagnostic colonoscopy.  Past medical history: Chronic headaches. Gastroesophageal reflux. Hypertension. Hypothyroidism. Thyroid nodule removed surgically. Fibromyalgia syndrome. Irritable bowel syndrome. Carpal tunnel syndrome. Colonic diverticulosis. Osteoarthritis. Shingles diagnosed in 2012. Tonsillectomy. Appendectomy. Right oophorectomy. Breast biopsy. Arthroscopic surgeries on both knees. Laparoscopic cholecystectomy. Right knee replacement surgery. Benign parotid gland removed surgically  Medication allergies: Beta blockers caused nightmares. Erythromycin. Septra. Codeine.   Exam: The patient is alert and lying comfortably on the endoscopy stretcher. Lungs are clear to auscultation. Cardiac exam reveals a regular rhythm. Abdomen is soft nontender to palpation.  Plan: Proceed with diagnostic colonoscopy to evaluate intermittent small-volume hematochezia.

## 2014-05-13 NOTE — Discharge Instructions (Signed)

## 2014-05-13 NOTE — Op Note (Signed)
Problem: Intermittent small-volume hematochezia. Normal screening colonoscopy performed on 12/03/2008  Endoscopist: Earle Gell  Premedication: Propofol administered by anesthesia  Procedure: Diagnostic colonoscopy The patient was placed in the left lateral decubitus position. Anal inspection and digital rectal exam were normal. The Pentax pediatric colonoscope was introduced into the rectum and advanced to the cecum. A normal-appearing ileocecal valve and appendiceal orifice were identified. Colonic preparation for the exam today was good.  Rectum. Normal. Retroflexed view of the distal rectum normal  Sigmoid colon and descending colon. Normal  Splenic flexure. Normal  Transverse colon. Normal  Hepatic flexure. Normal  Ascending colon. Normal  Cecum and ileocecal valve. Normal  Assessment: Normal diagnostic colonoscopy

## 2014-05-13 NOTE — Anesthesia Postprocedure Evaluation (Signed)
  Anesthesia Post-op Note  Patient: Carol Powers  Procedure(s) Performed: Procedure(s) (LRB): COLONOSCOPY WITH PROPOFOL (N/A)  Patient Location: PACU  Anesthesia Type: MAC  Level of Consciousness: awake and alert   Airway and Oxygen Therapy: Patient Spontanous Breathing  Post-op Pain: mild  Post-op Assessment: Post-op Vital signs reviewed, Patient's Cardiovascular Status Stable, Respiratory Function Stable, Patent Airway and No signs of Nausea or vomiting  Last Vitals:  Filed Vitals:   05/13/14 1155  BP: 95/40  Temp: 36.7 C  Resp: 15    Post-op Vital Signs: stable   Complications: No apparent anesthesia complications

## 2014-05-13 NOTE — Anesthesia Preprocedure Evaluation (Addendum)
Anesthesia Evaluation  Patient identified by MRN, date of birth, ID band Patient awake    Reviewed: Allergy & Precautions, H&P , NPO status , Patient's Chart, lab work & pertinent test results  History of Anesthesia Complications (+) PONV  Airway Mallampati: II TM Distance: >3 FB Neck ROM: Full    Dental no notable dental hx.    Pulmonary neg pulmonary ROS, former smoker,  breath sounds clear to auscultation  Pulmonary exam normal       Cardiovascular hypertension, Pt. on medications Rhythm:Regular Rate:Normal     Neuro/Psych negative neurological ROS  negative psych ROS   GI/Hepatic negative GI ROS, Neg liver ROS,   Endo/Other  Hypothyroidism   Renal/GU negative Renal ROS  negative genitourinary   Musculoskeletal negative musculoskeletal ROS (+)   Abdominal   Peds negative pediatric ROS (+)  Hematology negative hematology ROS (+)   Anesthesia Other Findings   Reproductive/Obstetrics negative OB ROS                          Anesthesia Physical Anesthesia Plan  ASA: II  Anesthesia Plan: MAC   Post-op Pain Management:    Induction: Intravenous  Airway Management Planned: Nasal Cannula and Simple Face Mask  Additional Equipment:   Intra-op Plan:   Post-operative Plan:   Informed Consent: I have reviewed the patients History and Physical, chart, labs and discussed the procedure including the risks, benefits and alternatives for the proposed anesthesia with the patient or authorized representative who has indicated his/her understanding and acceptance.   Dental advisory given  Plan Discussed with: CRNA and Surgeon  Anesthesia Plan Comments:        Anesthesia Quick Evaluation

## 2014-05-13 NOTE — Transfer of Care (Signed)
Immediate Anesthesia Transfer of Care Note  Patient: Carol Powers  Procedure(s) Performed: Procedure(s): COLONOSCOPY WITH PROPOFOL (N/A)  Patient Location: PACU and Endoscopy Unit  Anesthesia Type:MAC  Level of Consciousness: awake, alert , oriented and patient cooperative  Airway & Oxygen Therapy: Patient Spontanous Breathing and Patient connected to face mask oxygen  Post-op Assessment: Report given to PACU RN, Post -op Vital signs reviewed and stable and Patient moving all extremities  Post vital signs: Reviewed and stable  Complications: No apparent anesthesia complications

## 2014-05-16 ENCOUNTER — Encounter (HOSPITAL_COMMUNITY): Payer: Self-pay | Admitting: Gastroenterology

## 2014-06-12 DIAGNOSIS — G5600 Carpal tunnel syndrome, unspecified upper limb: Secondary | ICD-10-CM | POA: Diagnosis not present

## 2014-06-15 DIAGNOSIS — N390 Urinary tract infection, site not specified: Secondary | ICD-10-CM | POA: Diagnosis not present

## 2014-06-15 DIAGNOSIS — R300 Dysuria: Secondary | ICD-10-CM | POA: Diagnosis not present

## 2014-06-22 DIAGNOSIS — R350 Frequency of micturition: Secondary | ICD-10-CM | POA: Diagnosis not present

## 2014-06-22 DIAGNOSIS — N390 Urinary tract infection, site not specified: Secondary | ICD-10-CM | POA: Diagnosis not present

## 2014-07-02 DIAGNOSIS — H5202 Hypermetropia, left eye: Secondary | ICD-10-CM | POA: Diagnosis not present

## 2014-07-02 DIAGNOSIS — H40012 Open angle with borderline findings, low risk, left eye: Secondary | ICD-10-CM | POA: Diagnosis not present

## 2014-07-02 DIAGNOSIS — H3530 Unspecified macular degeneration: Secondary | ICD-10-CM | POA: Diagnosis not present

## 2014-07-02 DIAGNOSIS — H524 Presbyopia: Secondary | ICD-10-CM | POA: Diagnosis not present

## 2014-07-02 DIAGNOSIS — H25013 Cortical age-related cataract, bilateral: Secondary | ICD-10-CM | POA: Diagnosis not present

## 2014-07-02 DIAGNOSIS — H5211 Myopia, right eye: Secondary | ICD-10-CM | POA: Diagnosis not present

## 2014-07-02 DIAGNOSIS — H52223 Regular astigmatism, bilateral: Secondary | ICD-10-CM | POA: Diagnosis not present

## 2014-07-02 DIAGNOSIS — H2513 Age-related nuclear cataract, bilateral: Secondary | ICD-10-CM | POA: Diagnosis not present

## 2014-07-17 DIAGNOSIS — G5600 Carpal tunnel syndrome, unspecified upper limb: Secondary | ICD-10-CM | POA: Diagnosis not present

## 2014-07-17 DIAGNOSIS — M180 Bilateral primary osteoarthritis of first carpometacarpal joints: Secondary | ICD-10-CM | POA: Diagnosis not present

## 2014-07-22 DIAGNOSIS — Z23 Encounter for immunization: Secondary | ICD-10-CM | POA: Diagnosis not present

## 2014-10-08 DIAGNOSIS — M129 Arthropathy, unspecified: Secondary | ICD-10-CM | POA: Diagnosis not present

## 2014-10-08 DIAGNOSIS — G5602 Carpal tunnel syndrome, left upper limb: Secondary | ICD-10-CM | POA: Diagnosis not present

## 2014-10-08 DIAGNOSIS — M180 Bilateral primary osteoarthritis of first carpometacarpal joints: Secondary | ICD-10-CM | POA: Diagnosis not present

## 2014-10-08 DIAGNOSIS — G8918 Other acute postprocedural pain: Secondary | ICD-10-CM | POA: Diagnosis not present

## 2014-10-08 DIAGNOSIS — M654 Radial styloid tenosynovitis [de Quervain]: Secondary | ICD-10-CM | POA: Diagnosis not present

## 2014-10-13 DIAGNOSIS — M180 Bilateral primary osteoarthritis of first carpometacarpal joints: Secondary | ICD-10-CM | POA: Diagnosis not present

## 2014-10-13 DIAGNOSIS — G5600 Carpal tunnel syndrome, unspecified upper limb: Secondary | ICD-10-CM | POA: Diagnosis not present

## 2014-10-23 DIAGNOSIS — M180 Bilateral primary osteoarthritis of first carpometacarpal joints: Secondary | ICD-10-CM | POA: Diagnosis not present

## 2014-11-27 DIAGNOSIS — M25642 Stiffness of left hand, not elsewhere classified: Secondary | ICD-10-CM | POA: Diagnosis not present

## 2014-11-27 DIAGNOSIS — M6281 Muscle weakness (generalized): Secondary | ICD-10-CM | POA: Diagnosis not present

## 2014-11-27 DIAGNOSIS — M79642 Pain in left hand: Secondary | ICD-10-CM | POA: Diagnosis not present

## 2014-11-27 DIAGNOSIS — M1812 Unilateral primary osteoarthritis of first carpometacarpal joint, left hand: Secondary | ICD-10-CM | POA: Diagnosis not present

## 2014-11-27 DIAGNOSIS — G5602 Carpal tunnel syndrome, left upper limb: Secondary | ICD-10-CM | POA: Diagnosis not present

## 2014-12-18 DIAGNOSIS — M6281 Muscle weakness (generalized): Secondary | ICD-10-CM | POA: Diagnosis not present

## 2014-12-18 DIAGNOSIS — G5602 Carpal tunnel syndrome, left upper limb: Secondary | ICD-10-CM | POA: Diagnosis not present

## 2014-12-18 DIAGNOSIS — M1812 Unilateral primary osteoarthritis of first carpometacarpal joint, left hand: Secondary | ICD-10-CM | POA: Diagnosis not present

## 2014-12-18 DIAGNOSIS — M25642 Stiffness of left hand, not elsewhere classified: Secondary | ICD-10-CM | POA: Diagnosis not present

## 2015-01-07 DIAGNOSIS — Z1231 Encounter for screening mammogram for malignant neoplasm of breast: Secondary | ICD-10-CM | POA: Diagnosis not present

## 2015-01-14 DIAGNOSIS — N63 Unspecified lump in breast: Secondary | ICD-10-CM | POA: Diagnosis not present

## 2015-01-29 DIAGNOSIS — M79642 Pain in left hand: Secondary | ICD-10-CM | POA: Diagnosis not present

## 2015-01-29 DIAGNOSIS — M180 Bilateral primary osteoarthritis of first carpometacarpal joints: Secondary | ICD-10-CM | POA: Diagnosis not present

## 2015-01-29 DIAGNOSIS — G5602 Carpal tunnel syndrome, left upper limb: Secondary | ICD-10-CM | POA: Diagnosis not present

## 2015-03-24 DIAGNOSIS — Z0000 Encounter for general adult medical examination without abnormal findings: Secondary | ICD-10-CM | POA: Diagnosis not present

## 2015-03-24 DIAGNOSIS — Z5181 Encounter for therapeutic drug level monitoring: Secondary | ICD-10-CM | POA: Diagnosis not present

## 2015-03-24 DIAGNOSIS — K219 Gastro-esophageal reflux disease without esophagitis: Secondary | ICD-10-CM | POA: Diagnosis not present

## 2015-03-24 DIAGNOSIS — Z79899 Other long term (current) drug therapy: Secondary | ICD-10-CM | POA: Diagnosis not present

## 2015-03-24 DIAGNOSIS — Z1389 Encounter for screening for other disorder: Secondary | ICD-10-CM | POA: Diagnosis not present

## 2015-03-24 DIAGNOSIS — M179 Osteoarthritis of knee, unspecified: Secondary | ICD-10-CM | POA: Diagnosis not present

## 2015-03-24 DIAGNOSIS — G43909 Migraine, unspecified, not intractable, without status migrainosus: Secondary | ICD-10-CM | POA: Diagnosis not present

## 2015-03-24 DIAGNOSIS — I10 Essential (primary) hypertension: Secondary | ICD-10-CM | POA: Diagnosis not present

## 2015-03-24 DIAGNOSIS — E039 Hypothyroidism, unspecified: Secondary | ICD-10-CM | POA: Diagnosis not present

## 2015-04-14 DIAGNOSIS — R110 Nausea: Secondary | ICD-10-CM | POA: Diagnosis not present

## 2015-06-09 DIAGNOSIS — M1712 Unilateral primary osteoarthritis, left knee: Secondary | ICD-10-CM | POA: Diagnosis not present

## 2015-06-09 DIAGNOSIS — Z96651 Presence of right artificial knee joint: Secondary | ICD-10-CM | POA: Diagnosis not present

## 2015-06-25 DIAGNOSIS — Z23 Encounter for immunization: Secondary | ICD-10-CM | POA: Diagnosis not present

## 2015-07-08 DIAGNOSIS — H2513 Age-related nuclear cataract, bilateral: Secondary | ICD-10-CM | POA: Diagnosis not present

## 2015-07-08 DIAGNOSIS — H5202 Hypermetropia, left eye: Secondary | ICD-10-CM | POA: Diagnosis not present

## 2015-07-08 DIAGNOSIS — H353131 Nonexudative age-related macular degeneration, bilateral, early dry stage: Secondary | ICD-10-CM | POA: Diagnosis not present

## 2015-07-08 DIAGNOSIS — H25013 Cortical age-related cataract, bilateral: Secondary | ICD-10-CM | POA: Diagnosis not present

## 2015-07-08 DIAGNOSIS — S0500XA Injury of conjunctiva and corneal abrasion without foreign body, unspecified eye, initial encounter: Secondary | ICD-10-CM | POA: Diagnosis not present

## 2015-07-08 DIAGNOSIS — H52223 Regular astigmatism, bilateral: Secondary | ICD-10-CM | POA: Diagnosis not present

## 2015-07-08 DIAGNOSIS — H524 Presbyopia: Secondary | ICD-10-CM | POA: Diagnosis not present

## 2015-07-08 DIAGNOSIS — H11153 Pinguecula, bilateral: Secondary | ICD-10-CM | POA: Diagnosis not present

## 2015-07-08 DIAGNOSIS — H18413 Arcus senilis, bilateral: Secondary | ICD-10-CM | POA: Diagnosis not present

## 2015-07-08 DIAGNOSIS — H40012 Open angle with borderline findings, low risk, left eye: Secondary | ICD-10-CM | POA: Diagnosis not present

## 2015-07-16 DIAGNOSIS — B078 Other viral warts: Secondary | ICD-10-CM | POA: Diagnosis not present

## 2015-07-16 DIAGNOSIS — L218 Other seborrheic dermatitis: Secondary | ICD-10-CM | POA: Diagnosis not present

## 2015-07-16 DIAGNOSIS — L739 Follicular disorder, unspecified: Secondary | ICD-10-CM | POA: Diagnosis not present

## 2015-07-16 DIAGNOSIS — L821 Other seborrheic keratosis: Secondary | ICD-10-CM | POA: Diagnosis not present

## 2015-07-16 DIAGNOSIS — D485 Neoplasm of uncertain behavior of skin: Secondary | ICD-10-CM | POA: Diagnosis not present

## 2015-07-16 DIAGNOSIS — L738 Other specified follicular disorders: Secondary | ICD-10-CM | POA: Diagnosis not present

## 2016-01-13 DIAGNOSIS — Z1231 Encounter for screening mammogram for malignant neoplasm of breast: Secondary | ICD-10-CM | POA: Diagnosis not present

## 2016-02-15 ENCOUNTER — Other Ambulatory Visit: Payer: Self-pay | Admitting: Nurse Practitioner

## 2016-02-15 DIAGNOSIS — R110 Nausea: Secondary | ICD-10-CM | POA: Diagnosis not present

## 2016-02-15 DIAGNOSIS — R1031 Right lower quadrant pain: Secondary | ICD-10-CM | POA: Diagnosis not present

## 2016-02-15 DIAGNOSIS — R300 Dysuria: Secondary | ICD-10-CM | POA: Diagnosis not present

## 2016-02-18 ENCOUNTER — Ambulatory Visit
Admission: RE | Admit: 2016-02-18 | Discharge: 2016-02-18 | Disposition: A | Discharge status: Home / Self Care | Payer: Medicare Other | Source: Ambulatory Visit | Attending: Nurse Practitioner | Admitting: Nurse Practitioner

## 2016-02-18 DIAGNOSIS — R1031 Right lower quadrant pain: Secondary | ICD-10-CM

## 2016-02-18 DIAGNOSIS — K7689 Other specified diseases of liver: Secondary | ICD-10-CM | POA: Diagnosis not present

## 2016-02-19 ENCOUNTER — Other Ambulatory Visit: Payer: Medicare Other

## 2016-02-26 DIAGNOSIS — M546 Pain in thoracic spine: Secondary | ICD-10-CM | POA: Diagnosis not present

## 2016-03-24 DIAGNOSIS — E782 Mixed hyperlipidemia: Secondary | ICD-10-CM | POA: Diagnosis not present

## 2016-03-24 DIAGNOSIS — E039 Hypothyroidism, unspecified: Secondary | ICD-10-CM | POA: Diagnosis not present

## 2016-03-24 DIAGNOSIS — Z1389 Encounter for screening for other disorder: Secondary | ICD-10-CM | POA: Diagnosis not present

## 2016-03-24 DIAGNOSIS — Z0000 Encounter for general adult medical examination without abnormal findings: Secondary | ICD-10-CM | POA: Diagnosis not present

## 2016-03-24 DIAGNOSIS — I10 Essential (primary) hypertension: Secondary | ICD-10-CM | POA: Diagnosis not present

## 2016-07-13 DIAGNOSIS — H11423 Conjunctival edema, bilateral: Secondary | ICD-10-CM | POA: Diagnosis not present

## 2016-07-13 DIAGNOSIS — H353131 Nonexudative age-related macular degeneration, bilateral, early dry stage: Secondary | ICD-10-CM | POA: Diagnosis not present

## 2016-07-13 DIAGNOSIS — H40013 Open angle with borderline findings, low risk, bilateral: Secondary | ICD-10-CM | POA: Diagnosis not present

## 2016-07-13 DIAGNOSIS — H04123 Dry eye syndrome of bilateral lacrimal glands: Secondary | ICD-10-CM | POA: Diagnosis not present

## 2016-07-13 DIAGNOSIS — H40003 Preglaucoma, unspecified, bilateral: Secondary | ICD-10-CM | POA: Diagnosis not present

## 2016-07-13 DIAGNOSIS — Z23 Encounter for immunization: Secondary | ICD-10-CM | POA: Diagnosis not present

## 2016-07-13 DIAGNOSIS — H25013 Cortical age-related cataract, bilateral: Secondary | ICD-10-CM | POA: Diagnosis not present

## 2016-07-13 DIAGNOSIS — H52223 Regular astigmatism, bilateral: Secondary | ICD-10-CM | POA: Diagnosis not present

## 2016-07-13 DIAGNOSIS — H5202 Hypermetropia, left eye: Secondary | ICD-10-CM | POA: Diagnosis not present

## 2016-07-13 DIAGNOSIS — H5211 Myopia, right eye: Secondary | ICD-10-CM | POA: Diagnosis not present

## 2016-07-13 DIAGNOSIS — H18413 Arcus senilis, bilateral: Secondary | ICD-10-CM | POA: Diagnosis not present

## 2016-07-13 DIAGNOSIS — H25043 Posterior subcapsular polar age-related cataract, bilateral: Secondary | ICD-10-CM | POA: Diagnosis not present

## 2016-07-13 DIAGNOSIS — H11153 Pinguecula, bilateral: Secondary | ICD-10-CM | POA: Diagnosis not present

## 2016-08-16 DIAGNOSIS — R350 Frequency of micturition: Secondary | ICD-10-CM | POA: Diagnosis not present

## 2016-09-01 DIAGNOSIS — R0789 Other chest pain: Secondary | ICD-10-CM | POA: Diagnosis not present

## 2016-09-29 DIAGNOSIS — D485 Neoplasm of uncertain behavior of skin: Secondary | ICD-10-CM | POA: Diagnosis not present

## 2016-09-29 DIAGNOSIS — L723 Sebaceous cyst: Secondary | ICD-10-CM | POA: Diagnosis not present

## 2016-09-29 DIAGNOSIS — L821 Other seborrheic keratosis: Secondary | ICD-10-CM | POA: Diagnosis not present

## 2016-09-29 DIAGNOSIS — D225 Melanocytic nevi of trunk: Secondary | ICD-10-CM | POA: Diagnosis not present

## 2016-09-29 DIAGNOSIS — L820 Inflamed seborrheic keratosis: Secondary | ICD-10-CM | POA: Diagnosis not present

## 2016-10-04 DIAGNOSIS — H353130 Nonexudative age-related macular degeneration, bilateral, stage unspecified: Secondary | ICD-10-CM | POA: Diagnosis not present

## 2016-10-04 DIAGNOSIS — H25013 Cortical age-related cataract, bilateral: Secondary | ICD-10-CM | POA: Diagnosis not present

## 2016-10-04 DIAGNOSIS — H2513 Age-related nuclear cataract, bilateral: Secondary | ICD-10-CM | POA: Diagnosis not present

## 2016-10-04 DIAGNOSIS — H2511 Age-related nuclear cataract, right eye: Secondary | ICD-10-CM | POA: Diagnosis not present

## 2016-10-04 DIAGNOSIS — H18413 Arcus senilis, bilateral: Secondary | ICD-10-CM | POA: Diagnosis not present

## 2016-10-04 DIAGNOSIS — H25043 Posterior subcapsular polar age-related cataract, bilateral: Secondary | ICD-10-CM | POA: Diagnosis not present

## 2016-10-19 DIAGNOSIS — E039 Hypothyroidism, unspecified: Secondary | ICD-10-CM | POA: Diagnosis not present

## 2016-10-21 DIAGNOSIS — I10 Essential (primary) hypertension: Secondary | ICD-10-CM | POA: Diagnosis not present

## 2016-10-21 DIAGNOSIS — K219 Gastro-esophageal reflux disease without esophagitis: Secondary | ICD-10-CM | POA: Diagnosis not present

## 2016-10-21 DIAGNOSIS — M654 Radial styloid tenosynovitis [de Quervain]: Secondary | ICD-10-CM | POA: Diagnosis not present

## 2016-10-25 DIAGNOSIS — L720 Epidermal cyst: Secondary | ICD-10-CM | POA: Diagnosis not present

## 2016-11-14 DIAGNOSIS — H2511 Age-related nuclear cataract, right eye: Secondary | ICD-10-CM | POA: Diagnosis not present

## 2016-11-15 DIAGNOSIS — K625 Hemorrhage of anus and rectum: Secondary | ICD-10-CM | POA: Diagnosis not present

## 2016-11-15 DIAGNOSIS — H2512 Age-related nuclear cataract, left eye: Secondary | ICD-10-CM | POA: Diagnosis not present

## 2016-12-05 DIAGNOSIS — H2512 Age-related nuclear cataract, left eye: Secondary | ICD-10-CM | POA: Diagnosis not present

## 2017-01-13 DIAGNOSIS — Z1231 Encounter for screening mammogram for malignant neoplasm of breast: Secondary | ICD-10-CM | POA: Diagnosis not present

## 2017-04-28 DIAGNOSIS — K219 Gastro-esophageal reflux disease without esophagitis: Secondary | ICD-10-CM | POA: Diagnosis not present

## 2017-04-28 DIAGNOSIS — Z1389 Encounter for screening for other disorder: Secondary | ICD-10-CM | POA: Diagnosis not present

## 2017-04-28 DIAGNOSIS — Z0000 Encounter for general adult medical examination without abnormal findings: Secondary | ICD-10-CM | POA: Diagnosis not present

## 2017-04-28 DIAGNOSIS — I10 Essential (primary) hypertension: Secondary | ICD-10-CM | POA: Diagnosis not present

## 2017-04-28 DIAGNOSIS — E039 Hypothyroidism, unspecified: Secondary | ICD-10-CM | POA: Diagnosis not present

## 2017-05-27 DIAGNOSIS — R51 Headache: Secondary | ICD-10-CM | POA: Diagnosis not present

## 2017-05-27 DIAGNOSIS — Z87891 Personal history of nicotine dependence: Secondary | ICD-10-CM | POA: Diagnosis not present

## 2017-05-27 DIAGNOSIS — I10 Essential (primary) hypertension: Secondary | ICD-10-CM | POA: Diagnosis not present

## 2017-05-27 DIAGNOSIS — H9319 Tinnitus, unspecified ear: Secondary | ICD-10-CM | POA: Diagnosis not present

## 2017-05-27 DIAGNOSIS — E039 Hypothyroidism, unspecified: Secondary | ICD-10-CM | POA: Diagnosis not present

## 2017-05-27 DIAGNOSIS — E860 Dehydration: Secondary | ICD-10-CM | POA: Diagnosis not present

## 2017-05-27 DIAGNOSIS — M1990 Unspecified osteoarthritis, unspecified site: Secondary | ICD-10-CM | POA: Diagnosis not present

## 2017-05-27 DIAGNOSIS — E785 Hyperlipidemia, unspecified: Secondary | ICD-10-CM | POA: Diagnosis not present

## 2017-05-27 DIAGNOSIS — Z8719 Personal history of other diseases of the digestive system: Secondary | ICD-10-CM | POA: Diagnosis not present

## 2017-05-27 DIAGNOSIS — G459 Transient cerebral ischemic attack, unspecified: Secondary | ICD-10-CM | POA: Diagnosis not present

## 2017-05-27 DIAGNOSIS — J984 Other disorders of lung: Secondary | ICD-10-CM | POA: Diagnosis not present

## 2017-05-27 DIAGNOSIS — Z79899 Other long term (current) drug therapy: Secondary | ICD-10-CM | POA: Diagnosis not present

## 2017-05-27 DIAGNOSIS — H532 Diplopia: Secondary | ICD-10-CM | POA: Diagnosis not present

## 2017-05-28 DIAGNOSIS — G459 Transient cerebral ischemic attack, unspecified: Secondary | ICD-10-CM | POA: Diagnosis not present

## 2017-05-28 DIAGNOSIS — I10 Essential (primary) hypertension: Secondary | ICD-10-CM | POA: Diagnosis not present

## 2017-05-28 DIAGNOSIS — E860 Dehydration: Secondary | ICD-10-CM | POA: Diagnosis not present

## 2017-05-28 DIAGNOSIS — I6523 Occlusion and stenosis of bilateral carotid arteries: Secondary | ICD-10-CM | POA: Diagnosis not present

## 2017-05-28 DIAGNOSIS — H532 Diplopia: Secondary | ICD-10-CM | POA: Diagnosis not present

## 2017-05-28 DIAGNOSIS — I6781 Acute cerebrovascular insufficiency: Secondary | ICD-10-CM | POA: Diagnosis not present

## 2017-05-30 DIAGNOSIS — H16223 Keratoconjunctivitis sicca, not specified as Sjogren's, bilateral: Secondary | ICD-10-CM | POA: Diagnosis not present

## 2017-05-30 DIAGNOSIS — H532 Diplopia: Secondary | ICD-10-CM | POA: Diagnosis not present

## 2017-05-30 DIAGNOSIS — H5021 Vertical strabismus, right eye: Secondary | ICD-10-CM | POA: Diagnosis not present

## 2017-05-30 DIAGNOSIS — H353131 Nonexudative age-related macular degeneration, bilateral, early dry stage: Secondary | ICD-10-CM | POA: Diagnosis not present

## 2017-05-30 DIAGNOSIS — H5310 Unspecified subjective visual disturbances: Secondary | ICD-10-CM | POA: Diagnosis not present

## 2017-05-30 DIAGNOSIS — Z961 Presence of intraocular lens: Secondary | ICD-10-CM | POA: Diagnosis not present

## 2017-06-02 DIAGNOSIS — H532 Diplopia: Secondary | ICD-10-CM | POA: Diagnosis not present

## 2017-06-05 ENCOUNTER — Telehealth: Payer: Self-pay | Admitting: *Deleted

## 2017-06-05 ENCOUNTER — Ambulatory Visit (INDEPENDENT_AMBULATORY_CARE_PROVIDER_SITE_OTHER): Payer: Medicare Other | Admitting: Neurology

## 2017-06-05 ENCOUNTER — Encounter: Payer: Self-pay | Admitting: Neurology

## 2017-06-05 VITALS — BP 109/72 | HR 90 | Ht 61.0 in | Wt 154.0 lb

## 2017-06-05 DIAGNOSIS — Z9289 Personal history of other medical treatment: Secondary | ICD-10-CM

## 2017-06-05 DIAGNOSIS — H532 Diplopia: Secondary | ICD-10-CM

## 2017-06-05 DIAGNOSIS — F40240 Claustrophobia: Secondary | ICD-10-CM

## 2017-06-05 MED ORDER — ALPRAZOLAM 0.5 MG PO TABS: ORAL_TABLET | ORAL | 0 refills | Status: AC

## 2017-06-05 MED ORDER — ALPRAZOLAM 0.5 MG PO TABS: 0.5000 mg | ORAL_TABLET | Freq: Every evening | ORAL | 0 refills | Status: DC | PRN

## 2017-06-05 NOTE — Patient Instructions (Addendum)
Continue exercising regularly and take your medications as directed. As discussed, secondary prevention is key after a stroke-like event or ministroke/TIA. This means: taking care of blood sugar values or diabetes management (A1c goal of less than 7.0), good blood pressure (hypertension) control and optimizing cholesterol management (with LDL goal of less than 70), exercising daily or regularly within your own mobility limitations of course, and overall cardiovascular risk factor reduction, which includes screening for and treatment of obstructive sleep apnea (OSA) and weight management.  We will do another brain MRA for comparison.  Please continue your aspirin and exercise daily in the form of walking.   Please also hydrate better with water rather than tea. Your inflammatory markers called sedimentation rate and CRP were elevated when you were in the hospital in [facility]. Please follow-up with Carol Powers for this, to look for an inflammatory disease, could be coming form degenerative arthritis as well.

## 2017-06-05 NOTE — Progress Notes (Signed)
Subjective:    Patient ID: Carol Powers is a 76 y.o. female.  HPI    Star Age, MD, PhD [facility] [address], [address] [place], [place] [address]  Dear Dr. Laurann [place],  I saw your patient, Carol Powers, upon your kind request in my neurologic clinic today for initial consultation of new onset diplopia. The patient is accompanied  By her husband and her daughter today. As you know, Carol Powers is a 76 year old right-handed woman with an underlying medical history of hypertension, hypothyroidism, irritable bowel syndrome, fibromyalgia, carpal tunnel, diverticulosis, reflux disease, migraine headaches, prior UTI, degenerative joint disease of the knees, macular degeneration, and hyperlipidemia, who reports a sudden onset of diplopia recently when vacationing in [place]. She had a full stroke workup. She was seen by her ophthalmologist, Dr. Katy Fitch recently and he worried about a possibility of a small brainstem stroke. I have seen her one time several years ago for dizziness. I reviewed your office records from 05/30/2017.   She was hospitalized while in [facility]. Records from [facility] were reviewed, brought in by the patient. She had a brain MRI with and without contrast on 05/28/2017 which showed scattered leukorrhea was is without evidence of acute infarction, hemorrhage or mass. Carotid Doppler ultrasound from 05/28/2017 showed: Right internal carotid artery less than 50% stenosis by ultrasound criteria, left internal carotid artery less than 50% stenosis by ultrasound criteria. Chest x-ray from 05/27/2017, portable, showed no active cardiopulmonary disease. CT head without contrast from 05/27/2017 showed no evidence of acute intracranial pathology on nonenhanced CT. Laboratory workup included TSH which was normal, hematocrit was 33.7, otherwise hemoglobin 11.8, CMP showed normal findings, glucose of 110, UA was  unremarkable on 05/27/2017. CRP was 7.07, acetylcholine receptor antibodies were negative. ESR was elevated. She reports some improvement in her double vision. Seems to come on with position changes now, particularly with bending over. Denies actual vertigo, denies lightheadedness. She does not exercise very much and is planning to start walking on a regular basis. She does not drink water very well and is planning on improving her water intake. She likes tea she admits. No significant snoring or apneas are reported by husband.  She had a prior brain MRI w/wo contrast as well as head and neck MRA on 04/27/2013:   IMPRESSION:   1.  Mild distal small vessel disease. 2.  More significant than right PCA territory small vessel disease with a high-grade stenosis or occlusion of the distal P2 segment. 3.  No other significant proximal stenosis, aneurysm, or branch vessel occlusion.  Previously:  06/04/2013: Carol Powers is a very pleasant 76 year old right-handed woman with an underlying medical history of migraines, reflux disease, hypertension, hypothyroidism, irritable bowel syndrome, fibromyalgia, carpal tunnel syndrome, diverticulosis, and arthritis, who has had about 5 or 6 spells of dizziness since last year. She reports a sudden onset of spinning sensation and no warning signs. Her vertigo symptoms may last up to 2 hours but are typically in the 15-30 minute range. She had an MRI and MRA of the brain and MRA of the neck. I reviewed her MRI reports. Her neck MRA showed mild tortuosity of the cervical ICA bilaterally. No significant stenosis was seen. 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 She quit smoking years ago, she drinks EtOH very rarely. Her father had a MI.  A MA has vertigo at times, but she is in her 90s. Her mother had a head tremor and died at 2 from kidney  cancer. There is cancer on the maternal side. She has 2 daughters, both have migraines. She has a personal hx of migraines, which improved post-menopause. Never had TIA or stroke symptoms, denying sudden onset of one sided weakness, numbness, tingling, slurring of speech or droopy face, hearing loss, diplopia or visual field cut or monocular loss of vision, and denies recurrent headaches. She has a Hx of tinnitus, but this is mild and not new. She has been on ASA 81 mg for the last month. She reports occasional snoring per husband's feedback. He has never reported any apneas to her. She does not wake up gasping or choking. She was given meclizine when she went to the emergency room one time. She felt it was somewhat helpful.  Her Past Medical History Is Significant For: Past Medical History:  Diagnosis Date  . Carpal tunnel syndrome   . Carpal tunnel syndrome   . Diverticulosis   . Fibrocystic breast disease   . Fibromyalgia   . HTN (hypertension)   . Hypothyroidism   . IBS (irritable bowel syndrome)   . Macular degeneration   . Migraine   . PONV (postoperative nausea and vomiting)   . Reflux   . Sty    right eye last few days, no drainage    Her Past Surgical History Is Significant For: Past Surgical History:  Procedure Laterality Date  . APPENDECTOMY    . arthroscopic knee surgery Bilateral yrs ago  . CHOLECYSTECTOMY  9 yrs ago  . COLONOSCOPY WITH PROPOFOL N/A 05/13/2014   Procedure: COLONOSCOPY WITH PROPOFOL;  Surgeon: Garlan Fair, MD;  Location: [facility];  Service: Endoscopy;  Laterality: N/A;  . REPLACEMENT TOTAL KNEE Right 7 yrs ago  . thryoid surgery  20 yrs ago   partial    Her Family History Is Significant For: Family History  Problem Relation Age of Onset  . Heart failure Father   . Cancer Mother     Her Social History Is Significant For: Social History   Social History  . Marital status: Married    Spouse name: N/A  . Number of children: N/A  .  Years of education: N/A   Social History Main Topics  . Smoking status: Former Smoker    Packs/day: 0.25    Years: 2.00    Types: Cigarettes    Quit date: 06/05/1967  . Smokeless tobacco: Never Used  . Alcohol use 1.0 oz/week    2 drink(s) per week  . Drug use: No  . Sexual activity: Not Asked   Other Topics Concern  . None   Social History Narrative  . None    Her Allergies Are:  Allergies  Allergen Reactions  . Beta Adrenergic Blockers Other (See Comments)    Nightmares  . Codeine Nausea And Vomiting    severe  . Dexilant [Dexlansoprazole]     Rapid heart rate   . Erythromycin     Messed liver up  . Septra [Sulfamethoxazole-Trimethoprim]     Blisters   :   Her Current Medications Are:  Outpatient Encounter Prescriptions as of 06/05/2017  Medication Sig  . amLODipine (NORVASC) 10 MG tablet Take 10 mg by mouth every morning.   Marland Kitchen aspirin EC 81 MG tablet Take 81 mg by mouth daily.  . benazepril (LOTENSIN) 40 MG tablet Take 40 mg by mouth every morning.   Marland Kitchen  Calcium Carb-Cholecalciferol (CALCIUM 600 + D) 600-200 MG-UNIT TABS Take 1 tablet by mouth 2 (two) times daily.  Marland Kitchen esomeprazole (NEXIUM) 20 MG capsule Take 20 mg by mouth every evening.  . fish oil-omega-3 fatty acids 1000 MG capsule Take 2 g by mouth daily.  . Homeopathic Products [facility] ALLERGY EYE RELIEF OP) Apply to eye 3 (three) times daily. To right eye  . levothyroxine (SYNTHROID, LEVOTHROID) 88 MCG tablet Take 88 mcg by mouth daily before breakfast.  . Multiple Vitamins-Minerals (PRESERVISION/LUTEIN) CAPS Take 1 capsule by mouth 2 (two) times daily.  . nortriptyline (PAMELOR) 10 MG capsule Take 40 mg by mouth at bedtime.   Vladimir Faster Glycol-Propyl Glycol (SYSTANE ULTRA OP) Apply 4-5 drops to eye daily.  . RESTASIS MULTIDOSE 0.05 % ophthalmic emulsion 1 drop at bedtime.  . triamterene-hydrochlorothiazide (MAXZIDE-25) 37.5-25 MG per tablet Take 1 tablet by mouth every morning.  . vitamin C (ASCORBIC ACID)  500 MG tablet Take 500 mg by mouth daily.  Marland Kitchen ALPRAZolam (XANAX) 0.5 MG tablet Take 1-2 pills as needed on call to MRI.  . [DISCONTINUED] ALPRAZolam (XANAX) 0.5 MG tablet Take 1 tablet (0.5 mg total) by mouth at bedtime as needed for anxiety.  . [DISCONTINUED] carboxymethylcellulose (REFRESH PLUS) 0.5 % SOLN Place 1 drop into both eyes every morning.  . [DISCONTINUED] Hypromellose (GENTEAL OP) Apply 1 drop to eye at bedtime.  . [DISCONTINUED] loratadine (CLARITIN) 10 MG tablet Take 10 mg by mouth daily.   No facility-administered encounter medications on file as of 06/05/2017.   :   Review of Systems:  Out of a complete 14 point review of systems, all are reviewed and negative with the exception of these symptoms as listed below:   Review of Systems  Neurological:       Pt presents today to discuss her diplopia that started 2 weekends ago. Pt went to a hospital in [place], [place]. MRI and CT completed.    Objective:  Neurological Exam  Physical Exam Physical Examination:   Vitals:   06/05/17 1323  BP: 109/72  Pulse: 90    General Examination: The patient is a very pleasant 76 y.o. female in no acute distress. She appears well-developed and well-nourished and well groomed.   HEENT: Normocephalic, atraumatic, pupils are equal, round and reactive to light and accommodation.  Extraocular tracking is good without limitation to gaze excursion or nystagmus noted. Normal smooth pursuit is noted. Hearing is grossly intact. Face is symmetric with normal facial animation and normal facial sensation. Speech is clear with no dysarthria noted. There is no hypophonia. There is no lip, neck/head, jaw or voice tremor. Neck is supple with full range of passive and active motion. Oropharynx exam reveals: moderate mouth dryness, adequate dental hygiene. Tongue protrudes centrally and palate elevates symmetrically.   Chest: Clear to auscultation without wheezing, rhonchi or crackles noted.  Heart:  S1+S2+0, regular and normal without murmurs, rubs or gallops noted.   Abdomen: Soft, non-tender and non-distended with normal bowel sounds appreciated on auscultation.  Extremities: There is no pitting edema in the distal lower extremities bilaterally. Pedal pulses are intact.  Skin: Warm and dry without trophic changes noted. There are no varicose veins.  Musculoskeletal: exam reveals Arthritic changes in both hands, right more than left, unremarkable scar from right knee replacement surgery, mild left knee swelling and tenderness.   Neurologically:  Mental status: The patient is awake, alert and oriented in all 4 spheres. Her immediate and remote memory, attention, language skills and  fund of knowledge are appropriate. There is no evidence of aphasia, agnosia, apraxia or anomia. Speech is clear with normal prosody and enunciation. Thought process is linear. Mood is normal and affect is normal.  Cranial nerves II - XII are as described above under HEENT exam. In addition: shoulder shrug is normal with equal shoulder height noted. Motor exam: Normal bulk, strength and tone is noted. There is no drift, tremor or rebound. Romberg is negative. Reflexes are 1+ throughout. Fine motor skills and coordination: intact with normal finger taps, normal hand movements, normal rapid alternating patting, normal foot taps and normal foot agility.  Cerebellar testing: No dysmetria or intention tremor on finger to nose testing. Heel to shin is unremarkable bilaterally. There is no truncal or gait ataxia.  Sensory exam: intact to light touch, vibration, temperature sense in the upper and lower extremities.  Gait, station and balance: She stands easily. No veering to one side is noted. No leaning to one side is noted. Posture is age-appropriate and stance is narrow based. Gait shows normal stride length and normal pace, maybe a little cautious. No problems turning are noted.   Assessment and Plan:   In summary,  MELYNA HURON is a very pleasant 76 y.o.-year old female with an underlying medical history of hypertension, hypothyroidism, irritable bowel syndrome, fibromyalgia, carpal tunnel, diverticulosis, reflux disease, migraine headaches, prior UTI, degenerative joint disease of the knees, macular degeneration, and hyperlipidemia, who  Presents for neurological consultation of her new onset diplopia. This started earlier this month.She was hospitalized at [facility] where she was vacationing and had a full stroke workup. She has intermittent diplopia now. She feels that she has improved. She had workup negative for stroke which is reassuring. She has no lateralizing findings on neurological exam which is also reassuring. For comparison we will do an MRA and compare with findings from 2014. She is advised to continue with her aspirin and follow-up with her ophthalmologist as planned.We talked about stroke and TIA and vascular disease secondary prevention including healthy lifestyle, exercising, good hydration, good nutrition and low pressure as well as cholesterol management. She is somewhat limited in her exercise because of arthritis. Inflammatory markers were elevated during her laboratory workup when she was at [facility]. This included CRP and ESR, otherwise labs were fine. She is advised to follow-up with you in that regard to look into an underlying rheumatological illness but she does have a history of arthritis.  I ordered a brain MRA. I ordered Xanax for anxiety management as she is claustrophobic. So long as findings are stable or acceptable for age compare to her previous head MRA I can see her back on an as-needed basis. I answered all their questions today and the patient and her family were in agreement.  Thank you very much for allowing me to participate in the care of this nice patient. If I can be of any further assistance to you please do not hesitate to call me at  [phone].  Sincerely,   Star Age, MD, PhD

## 2017-06-05 NOTE — Telephone Encounter (Signed)
Pt request faxed to [facility] requesting labs MRI images.

## 2017-06-13 DIAGNOSIS — H532 Diplopia: Secondary | ICD-10-CM | POA: Diagnosis not present

## 2017-06-13 DIAGNOSIS — H02834 Dermatochalasis of left upper eyelid: Secondary | ICD-10-CM | POA: Diagnosis not present

## 2017-06-13 DIAGNOSIS — H02831 Dermatochalasis of right upper eyelid: Secondary | ICD-10-CM | POA: Diagnosis not present

## 2017-06-15 ENCOUNTER — Telehealth: Payer: Self-pay | Admitting: Neurology

## 2017-06-15 NOTE — Telephone Encounter (Signed)
I really don't have any other suggestions, would rec FU with ophthalmologist.

## 2017-06-15 NOTE — Telephone Encounter (Signed)
I called pt, advised her that Dr. Rexene Alberts does not have any other recommendations at this point and recommends a follow up with ophthalmologist. Pt says that she saw her ophthalmologist recently. Pt understands that I will call her with MRA results when they become available.

## 2017-06-15 NOTE — Telephone Encounter (Signed)
I called pt. I encouraged her to call [facility] to move her MRA appt to a sooner date. I explained that our office cannot move her appt to a sooner date, this depends on the radiologist's schedule. Pt will call [facility]. Her current appt is 06/22/17.  Pt reports that she is NOT having headaches but is wondering if she could take nortriptyline during the day to help with diplopia and nausea. Pt reports that Dr. Lavone Orn prescribes the nortriptyline for her. I advised her that I was not sure that nortriptyline will help with double vision and nausea but that she should speak with Dr. Laurann [place] about the nortriptyline if she is concerned about it.  Pt wants to know if Dr. Rexene Alberts recommends anything to help with her double vision and nausea.

## 2017-06-15 NOTE — Telephone Encounter (Signed)
Pt calling to inform that the episodes are occurring more re: her Double vision and nausea , 3 in the last 2 days.  Pt is asking for a call back to know if there is a way of speeding up her MRA.  Pt would also like to know what Dr Rexene Alberts thinks of her taking her nortriptyline (PAMELOR) 10 MG capsule during the day to help her head aches, please call

## 2017-06-22 ENCOUNTER — Ambulatory Visit
Admission: RE | Admit: 2017-06-22 | Discharge: 2017-06-22 | Disposition: A | Discharge status: Home / Self Care | Payer: Medicare Other | Source: Ambulatory Visit | Attending: Neurology | Admitting: Neurology

## 2017-06-22 DIAGNOSIS — H532 Diplopia: Secondary | ICD-10-CM

## 2017-06-22 DIAGNOSIS — Z9289 Personal history of other medical treatment: Secondary | ICD-10-CM

## 2017-06-23 NOTE — Progress Notes (Signed)
Please call and advise the patient that the recent scan we did was within normal limits. We did a brain MRI and findings were benign.  As discussed, she can FU with PCP at this point a and FU as scheduled with ophthalmologist. We previously talked about vascular prevention, please reiterate: Continue to monitor and take care of blood sugar values or diabetes management (A1c goal of less than 7.0), good blood pressure (hypertension, goal of less than 130/90) control and optimizing cholesterol management (with LDL goal of less than 70), exercising daily or regularly within your own mobility limitations of course, and overall cardiovascular risk factor reduction, which includes weight management with BMI of less than 30 (30+ is obesity).   Star Age, MD, PhD

## 2017-06-26 ENCOUNTER — Telehealth: Payer: Self-pay

## 2017-06-26 NOTE — Telephone Encounter (Signed)
I called Carol Powers. I advised her that her MRI was within normal limits and the findings were benign. Dr. Rexene Alberts recommends that Carol Powers follow up with PCP and with her ophthalmologist. I reiterated vascular prevention recommendations as Dr. Rexene Alberts listed. Carol Powers verbalized understanding of results. Carol Powers had no questions at this time but was encouraged to call back if questions arise.

## 2017-06-26 NOTE — Telephone Encounter (Signed)
Patient called office returning RN's call.  Please call °

## 2017-06-26 NOTE — Telephone Encounter (Signed)
-----   Message from Star Age, MD sent at 06/23/2017  1:03 PM EDT ----- Please call and advise the patient that the recent scan we did was within normal limits. We did a brain MRI and findings were benign.  As discussed, she can FU with PCP at this point a and FU as scheduled with ophthalmologist. We previously talked about vascular prevention, please reiterate: Continue to monitor and take care of blood sugar values or diabetes management (A1c goal of less than 7.0), good blood pressure (hypertension, goal of less than 130/90) control and optimizing cholesterol management (with LDL goal of less than 70), exercising daily or regularly within your own mobility limitations of course, and overall cardiovascular risk factor reduction, which includes weight management with BMI of less than 30 (30+ is obesity).   Star Age, MD, PhD

## 2017-06-26 NOTE — Telephone Encounter (Signed)
I called pt to discuss. No answer on mobile or home number. Left a message at home number asking pt to call me back.

## 2017-07-11 DIAGNOSIS — H5051 Esophoria: Secondary | ICD-10-CM | POA: Diagnosis not present

## 2017-07-11 DIAGNOSIS — H532 Diplopia: Secondary | ICD-10-CM | POA: Diagnosis not present

## 2017-07-11 DIAGNOSIS — H5053 Vertical heterophoria: Secondary | ICD-10-CM | POA: Diagnosis not present

## 2017-07-17 DIAGNOSIS — H532 Diplopia: Secondary | ICD-10-CM | POA: Diagnosis not present

## 2017-07-17 DIAGNOSIS — Z23 Encounter for immunization: Secondary | ICD-10-CM | POA: Diagnosis not present

## 2017-08-26 DIAGNOSIS — J218 Acute bronchiolitis due to other specified organisms: Secondary | ICD-10-CM | POA: Diagnosis not present

## 2017-09-07 DIAGNOSIS — R350 Frequency of micturition: Secondary | ICD-10-CM | POA: Diagnosis not present

## 2017-09-13 DIAGNOSIS — J209 Acute bronchitis, unspecified: Secondary | ICD-10-CM | POA: Diagnosis not present

## 2017-09-29 DIAGNOSIS — R05 Cough: Secondary | ICD-10-CM | POA: Diagnosis not present

## 2017-10-09 DIAGNOSIS — J0100 Acute maxillary sinusitis, unspecified: Secondary | ICD-10-CM | POA: Diagnosis not present

## 2017-10-09 DIAGNOSIS — R0981 Nasal congestion: Secondary | ICD-10-CM | POA: Diagnosis not present

## 2017-10-23 DIAGNOSIS — J209 Acute bronchitis, unspecified: Secondary | ICD-10-CM | POA: Diagnosis not present

## 2017-11-01 DIAGNOSIS — M545 Low back pain: Secondary | ICD-10-CM | POA: Diagnosis not present

## 2017-11-01 DIAGNOSIS — M1712 Unilateral primary osteoarthritis, left knee: Secondary | ICD-10-CM | POA: Diagnosis not present

## 2017-11-01 DIAGNOSIS — M25561 Pain in right knee: Secondary | ICD-10-CM | POA: Diagnosis not present

## 2017-11-01 DIAGNOSIS — M25551 Pain in right hip: Secondary | ICD-10-CM | POA: Diagnosis not present

## 2017-11-16 DIAGNOSIS — M1712 Unilateral primary osteoarthritis, left knee: Secondary | ICD-10-CM | POA: Diagnosis not present

## 2017-11-21 DIAGNOSIS — M545 Low back pain: Secondary | ICD-10-CM | POA: Diagnosis not present

## 2017-11-23 ENCOUNTER — Other Ambulatory Visit: Payer: Self-pay | Admitting: Internal Medicine

## 2017-11-23 ENCOUNTER — Ambulatory Visit
Admission: RE | Admit: 2017-11-23 | Discharge: 2017-11-23 | Disposition: A | Discharge status: Home / Self Care | Payer: Medicare Other | Source: Ambulatory Visit | Attending: Internal Medicine | Admitting: Internal Medicine

## 2017-11-23 DIAGNOSIS — M79651 Pain in right thigh: Secondary | ICD-10-CM | POA: Diagnosis not present

## 2017-11-23 DIAGNOSIS — M545 Low back pain: Secondary | ICD-10-CM | POA: Diagnosis not present

## 2017-11-23 DIAGNOSIS — R053 Chronic cough: Secondary | ICD-10-CM

## 2017-11-23 DIAGNOSIS — R05 Cough: Secondary | ICD-10-CM

## 2017-11-23 DIAGNOSIS — M899 Disorder of bone, unspecified: Secondary | ICD-10-CM | POA: Diagnosis not present

## 2017-11-23 DIAGNOSIS — M1711 Unilateral primary osteoarthritis, right knee: Secondary | ICD-10-CM | POA: Diagnosis not present

## 2017-11-24 ENCOUNTER — Other Ambulatory Visit: Payer: Self-pay | Admitting: Internal Medicine

## 2017-11-24 DIAGNOSIS — R918 Other nonspecific abnormal finding of lung field: Secondary | ICD-10-CM

## 2017-11-27 ENCOUNTER — Ambulatory Visit
Admission: RE | Admit: 2017-11-27 | Discharge: 2017-11-27 | Disposition: A | Discharge status: Home / Self Care | Payer: Medicare Other | Source: Ambulatory Visit | Attending: Internal Medicine | Admitting: Internal Medicine

## 2017-11-27 DIAGNOSIS — R222 Localized swelling, mass and lump, trunk: Secondary | ICD-10-CM | POA: Diagnosis not present

## 2017-11-27 DIAGNOSIS — R918 Other nonspecific abnormal finding of lung field: Secondary | ICD-10-CM | POA: Diagnosis not present

## 2017-11-27 DIAGNOSIS — R300 Dysuria: Secondary | ICD-10-CM | POA: Diagnosis not present

## 2017-11-27 DIAGNOSIS — R5383 Other fatigue: Secondary | ICD-10-CM | POA: Diagnosis not present

## 2017-11-27 DIAGNOSIS — R110 Nausea: Secondary | ICD-10-CM | POA: Diagnosis not present

## 2017-11-27 DIAGNOSIS — M25562 Pain in left knee: Secondary | ICD-10-CM | POA: Diagnosis not present

## 2017-11-27 MED ORDER — IOPAMIDOL (ISOVUE-300) INJECTION 61%: 50.0000 mL | Freq: Once | INTRAVENOUS | Status: DC | PRN

## 2017-11-28 ENCOUNTER — Telehealth: Payer: Self-pay | Admitting: *Deleted

## 2017-11-28 ENCOUNTER — Other Ambulatory Visit (HOSPITAL_COMMUNITY): Payer: Self-pay | Admitting: Internal Medicine

## 2017-11-28 DIAGNOSIS — R918 Other nonspecific abnormal finding of lung field: Secondary | ICD-10-CM

## 2017-11-28 NOTE — Telephone Encounter (Signed)
Oncology Nurse Navigator Documentation  Oncology Nurse Navigator Flowsheets 11/28/2017  Navigator Location [facility]  Referral date to RadOnc/MedOnc 11/28/2017  Navigator Encounter Type Telephone/I received referral on Ms. Hashem today.  I called her to give her an appt to be seen. She verbalized understanding of appt time and place.   Telephone Outgoing Call  Treatment Phase Abnormal Scans  Barriers/Navigation Needs Coordination of Care;Education  Education Other  Interventions Coordination of Care;Education  Coordination of Care Appts  Education Method Verbal  Acuity Level 2  Time Spent with Patient 30

## 2017-11-29 ENCOUNTER — Emergency Department (HOSPITAL_COMMUNITY): Payer: Medicare Other

## 2017-11-29 ENCOUNTER — Telehealth: Payer: Self-pay | Admitting: *Deleted

## 2017-11-29 ENCOUNTER — Encounter (HOSPITAL_COMMUNITY): Payer: Self-pay

## 2017-11-29 ENCOUNTER — Telehealth: Payer: Self-pay | Admitting: Medical Oncology

## 2017-11-29 ENCOUNTER — Other Ambulatory Visit: Payer: Self-pay

## 2017-11-29 ENCOUNTER — Other Ambulatory Visit: Payer: Medicare Other

## 2017-11-29 ENCOUNTER — Inpatient Hospital Stay (HOSPITAL_COMMUNITY)
Admission: EM | Admit: 2017-11-29 | Discharge: 2017-12-25 | DRG: 853 | Disposition: E | Payer: Medicare Other | Attending: Pulmonary Disease | Admitting: Pulmonary Disease

## 2017-11-29 ENCOUNTER — Ambulatory Visit: Payer: Medicare Other | Admitting: Internal Medicine

## 2017-11-29 ENCOUNTER — Inpatient Hospital Stay (HOSPITAL_COMMUNITY): Payer: Medicare Other

## 2017-11-29 DIAGNOSIS — E874 Mixed disorder of acid-base balance: Secondary | ICD-10-CM | POA: Diagnosis present

## 2017-11-29 DIAGNOSIS — R402232 Coma scale, best verbal response, inappropriate words, at arrival to emergency department: Secondary | ICD-10-CM | POA: Diagnosis present

## 2017-11-29 DIAGNOSIS — C7972 Secondary malignant neoplasm of left adrenal gland: Secondary | ICD-10-CM | POA: Diagnosis present

## 2017-11-29 DIAGNOSIS — R4020 Unspecified coma: Secondary | ICD-10-CM | POA: Diagnosis not present

## 2017-11-29 DIAGNOSIS — Z7982 Long term (current) use of aspirin: Secondary | ICD-10-CM

## 2017-11-29 DIAGNOSIS — J181 Lobar pneumonia, unspecified organism: Secondary | ICD-10-CM | POA: Diagnosis present

## 2017-11-29 DIAGNOSIS — K219 Gastro-esophageal reflux disease without esophagitis: Secondary | ICD-10-CM | POA: Diagnosis present

## 2017-11-29 DIAGNOSIS — I10 Essential (primary) hypertension: Secondary | ICD-10-CM | POA: Diagnosis not present

## 2017-11-29 DIAGNOSIS — Z881 Allergy status to other antibiotic agents status: Secondary | ICD-10-CM

## 2017-11-29 DIAGNOSIS — C7971 Secondary malignant neoplasm of right adrenal gland: Secondary | ICD-10-CM | POA: Diagnosis present

## 2017-11-29 DIAGNOSIS — E278 Other specified disorders of adrenal gland: Secondary | ICD-10-CM

## 2017-11-29 DIAGNOSIS — J9690 Respiratory failure, unspecified, unspecified whether with hypoxia or hypercapnia: Secondary | ICD-10-CM | POA: Diagnosis not present

## 2017-11-29 DIAGNOSIS — M797 Fibromyalgia: Secondary | ICD-10-CM | POA: Diagnosis not present

## 2017-11-29 DIAGNOSIS — I959 Hypotension, unspecified: Secondary | ICD-10-CM | POA: Diagnosis present

## 2017-11-29 DIAGNOSIS — J189 Pneumonia, unspecified organism: Secondary | ICD-10-CM | POA: Diagnosis not present

## 2017-11-29 DIAGNOSIS — C7951 Secondary malignant neoplasm of bone: Secondary | ICD-10-CM | POA: Diagnosis present

## 2017-11-29 DIAGNOSIS — Z978 Presence of other specified devices: Secondary | ICD-10-CM

## 2017-11-29 DIAGNOSIS — Z87891 Personal history of nicotine dependence: Secondary | ICD-10-CM | POA: Diagnosis not present

## 2017-11-29 DIAGNOSIS — Z9049 Acquired absence of other specified parts of digestive tract: Secondary | ICD-10-CM

## 2017-11-29 DIAGNOSIS — K5790 Diverticulosis of intestine, part unspecified, without perforation or abscess without bleeding: Secondary | ICD-10-CM | POA: Diagnosis present

## 2017-11-29 DIAGNOSIS — E861 Hypovolemia: Secondary | ICD-10-CM | POA: Diagnosis not present

## 2017-11-29 DIAGNOSIS — N179 Acute kidney failure, unspecified: Secondary | ICD-10-CM

## 2017-11-29 DIAGNOSIS — J90 Pleural effusion, not elsewhere classified: Secondary | ICD-10-CM | POA: Diagnosis not present

## 2017-11-29 DIAGNOSIS — I9589 Other hypotension: Secondary | ICD-10-CM

## 2017-11-29 DIAGNOSIS — G43909 Migraine, unspecified, not intractable, without status migrainosus: Secondary | ICD-10-CM | POA: Diagnosis present

## 2017-11-29 DIAGNOSIS — R6521 Severe sepsis with septic shock: Secondary | ICD-10-CM | POA: Diagnosis present

## 2017-11-29 DIAGNOSIS — Z9289 Personal history of other medical treatment: Secondary | ICD-10-CM

## 2017-11-29 DIAGNOSIS — H3530 Unspecified macular degeneration: Secondary | ICD-10-CM | POA: Diagnosis not present

## 2017-11-29 DIAGNOSIS — J939 Pneumothorax, unspecified: Secondary | ICD-10-CM

## 2017-11-29 DIAGNOSIS — G9340 Encephalopathy, unspecified: Secondary | ICD-10-CM | POA: Diagnosis not present

## 2017-11-29 DIAGNOSIS — Z66 Do not resuscitate: Secondary | ICD-10-CM | POA: Diagnosis not present

## 2017-11-29 DIAGNOSIS — A419 Sepsis, unspecified organism: Principal | ICD-10-CM | POA: Diagnosis present

## 2017-11-29 DIAGNOSIS — D72829 Elevated white blood cell count, unspecified: Secondary | ICD-10-CM

## 2017-11-29 DIAGNOSIS — R0602 Shortness of breath: Secondary | ICD-10-CM | POA: Diagnosis not present

## 2017-11-29 DIAGNOSIS — I447 Left bundle-branch block, unspecified: Secondary | ICD-10-CM | POA: Diagnosis not present

## 2017-11-29 DIAGNOSIS — C3411 Malignant neoplasm of upper lobe, right bronchus or lung: Secondary | ICD-10-CM | POA: Diagnosis present

## 2017-11-29 DIAGNOSIS — Z4659 Encounter for fitting and adjustment of other gastrointestinal appliance and device: Secondary | ICD-10-CM

## 2017-11-29 DIAGNOSIS — C771 Secondary and unspecified malignant neoplasm of intrathoracic lymph nodes: Secondary | ICD-10-CM | POA: Diagnosis present

## 2017-11-29 DIAGNOSIS — N170 Acute kidney failure with tubular necrosis: Secondary | ICD-10-CM | POA: Diagnosis present

## 2017-11-29 DIAGNOSIS — R031 Nonspecific low blood-pressure reading: Secondary | ICD-10-CM | POA: Diagnosis not present

## 2017-11-29 DIAGNOSIS — C3490 Malignant neoplasm of unspecified part of unspecified bronchus or lung: Secondary | ICD-10-CM | POA: Diagnosis not present

## 2017-11-29 DIAGNOSIS — Z8249 Family history of ischemic heart disease and other diseases of the circulatory system: Secondary | ICD-10-CM

## 2017-11-29 DIAGNOSIS — Z882 Allergy status to sulfonamides status: Secondary | ICD-10-CM

## 2017-11-29 DIAGNOSIS — C787 Secondary malignant neoplasm of liver and intrahepatic bile duct: Secondary | ICD-10-CM | POA: Diagnosis present

## 2017-11-29 DIAGNOSIS — R4182 Altered mental status, unspecified: Secondary | ICD-10-CM | POA: Diagnosis not present

## 2017-11-29 DIAGNOSIS — R402132 Coma scale, eyes open, to sound, at arrival to emergency department: Secondary | ICD-10-CM | POA: Diagnosis present

## 2017-11-29 DIAGNOSIS — J9601 Acute respiratory failure with hypoxia: Secondary | ICD-10-CM | POA: Diagnosis present

## 2017-11-29 DIAGNOSIS — E440 Moderate protein-calorie malnutrition: Secondary | ICD-10-CM | POA: Diagnosis not present

## 2017-11-29 DIAGNOSIS — E871 Hypo-osmolality and hyponatremia: Secondary | ICD-10-CM | POA: Diagnosis present

## 2017-11-29 DIAGNOSIS — N390 Urinary tract infection, site not specified: Secondary | ICD-10-CM | POA: Diagnosis present

## 2017-11-29 DIAGNOSIS — J9600 Acute respiratory failure, unspecified whether with hypoxia or hypercapnia: Secondary | ICD-10-CM | POA: Diagnosis not present

## 2017-11-29 DIAGNOSIS — F329 Major depressive disorder, single episode, unspecified: Secondary | ICD-10-CM | POA: Diagnosis present

## 2017-11-29 DIAGNOSIS — Z809 Family history of malignant neoplasm, unspecified: Secondary | ICD-10-CM

## 2017-11-29 DIAGNOSIS — Z6826 Body mass index (BMI) 26.0-26.9, adult: Secondary | ICD-10-CM

## 2017-11-29 DIAGNOSIS — C7970 Secondary malignant neoplasm of unspecified adrenal gland: Secondary | ICD-10-CM | POA: Diagnosis not present

## 2017-11-29 DIAGNOSIS — Z888 Allergy status to other drugs, medicaments and biological substances status: Secondary | ICD-10-CM

## 2017-11-29 DIAGNOSIS — Z9911 Dependence on respirator [ventilator] status: Secondary | ICD-10-CM | POA: Diagnosis not present

## 2017-11-29 DIAGNOSIS — Z452 Encounter for adjustment and management of vascular access device: Secondary | ICD-10-CM | POA: Diagnosis not present

## 2017-11-29 DIAGNOSIS — J9602 Acute respiratory failure with hypercapnia: Secondary | ICD-10-CM | POA: Diagnosis not present

## 2017-11-29 DIAGNOSIS — R402362 Coma scale, best motor response, obeys commands, at arrival to emergency department: Secondary | ICD-10-CM | POA: Diagnosis present

## 2017-11-29 DIAGNOSIS — C3491 Malignant neoplasm of unspecified part of right bronchus or lung: Secondary | ICD-10-CM | POA: Diagnosis not present

## 2017-11-29 DIAGNOSIS — B9620 Unspecified Escherichia coli [E. coli] as the cause of diseases classified elsewhere: Secondary | ICD-10-CM | POA: Diagnosis present

## 2017-11-29 DIAGNOSIS — R918 Other nonspecific abnormal finding of lung field: Secondary | ICD-10-CM | POA: Diagnosis not present

## 2017-11-29 DIAGNOSIS — R739 Hyperglycemia, unspecified: Secondary | ICD-10-CM | POA: Diagnosis not present

## 2017-11-29 DIAGNOSIS — D649 Anemia, unspecified: Secondary | ICD-10-CM | POA: Diagnosis present

## 2017-11-29 DIAGNOSIS — Z79890 Hormone replacement therapy (postmenopausal): Secondary | ICD-10-CM

## 2017-11-29 DIAGNOSIS — E8770 Fluid overload, unspecified: Secondary | ICD-10-CM | POA: Diagnosis not present

## 2017-11-29 DIAGNOSIS — F419 Anxiety disorder, unspecified: Secondary | ICD-10-CM | POA: Diagnosis present

## 2017-11-29 DIAGNOSIS — Z885 Allergy status to narcotic agent status: Secondary | ICD-10-CM

## 2017-11-29 DIAGNOSIS — E039 Hypothyroidism, unspecified: Secondary | ICD-10-CM | POA: Diagnosis present

## 2017-11-29 DIAGNOSIS — Z96651 Presence of right artificial knee joint: Secondary | ICD-10-CM | POA: Diagnosis present

## 2017-11-29 DIAGNOSIS — Z4682 Encounter for fitting and adjustment of non-vascular catheter: Secondary | ICD-10-CM | POA: Diagnosis not present

## 2017-11-29 DIAGNOSIS — E876 Hypokalemia: Secondary | ICD-10-CM | POA: Diagnosis not present

## 2017-11-29 DIAGNOSIS — E162 Hypoglycemia, unspecified: Secondary | ICD-10-CM | POA: Diagnosis not present

## 2017-11-29 DIAGNOSIS — E860 Dehydration: Secondary | ICD-10-CM | POA: Diagnosis present

## 2017-11-29 DIAGNOSIS — J9691 Respiratory failure, unspecified with hypoxia: Secondary | ICD-10-CM | POA: Diagnosis not present

## 2017-11-29 DIAGNOSIS — J9509 Other tracheostomy complication: Secondary | ICD-10-CM | POA: Diagnosis not present

## 2017-11-29 LAB — COMPREHENSIVE METABOLIC PANEL
ALBUMIN: 3.1 g/dL — AB (ref 3.5–5.0)
ALT: 17 U/L (ref 14–54)
AST: 38 U/L (ref 15–41)
Alkaline Phosphatase: 98 U/L (ref 38–126)
Anion gap: 12 (ref 5–15)
BUN: 41 mg/dL — AB (ref 6–20)
CO2: 20 mmol/L — AB (ref 22–32)
Calcium: 9.8 mg/dL (ref 8.9–10.3)
Chloride: 92 mmol/L — ABNORMAL LOW (ref 101–111)
Creatinine, Ser: 2.22 mg/dL — ABNORMAL HIGH (ref 0.44–1.00)
GFR calc Af Amer: 24 mL/min — ABNORMAL LOW (ref >60)
GFR, EST NON AFRICAN AMERICAN: 20 mL/min — AB (ref >60)
Glucose, Bld: 110 mg/dL — ABNORMAL HIGH (ref 65–99)
POTASSIUM: 4.8 mmol/L (ref 3.5–5.1)
SODIUM: 124 mmol/L — AB (ref 135–145)
Total Bilirubin: 1.1 mg/dL (ref 0.3–1.2)
Total Protein: 6.8 g/dL (ref 6.5–8.1)

## 2017-11-29 LAB — URINALYSIS, ROUTINE W REFLEX MICROSCOPIC
Bilirubin Urine: NEGATIVE
Glucose, UA: NEGATIVE mg/dL
Ketones, ur: NEGATIVE mg/dL
Nitrite: NEGATIVE
PROTEIN: 100 mg/dL — AB
SPECIFIC GRAVITY, URINE: 1.014 (ref 1.005–1.030)
pH: 5 (ref 5.0–8.0)

## 2017-11-29 LAB — CBC WITH DIFFERENTIAL/PLATELET
BASOS ABS: 0.2 10*3/uL — AB (ref 0.0–0.1)
BASOS PCT: 1 %
Band Neutrophils: 8 %
EOS PCT: 0 %
Eosinophils Absolute: 0 10*3/uL (ref 0.0–0.7)
HEMATOCRIT: 32.7 % — AB (ref 36.0–46.0)
Hemoglobin: 11 g/dL — ABNORMAL LOW (ref 12.0–15.0)
Lymphocytes Relative: 8 %
Lymphs Abs: 1.3 10*3/uL (ref 0.7–4.0)
MCH: 28.1 pg (ref 26.0–34.0)
MCHC: 33.6 g/dL (ref 30.0–36.0)
MCV: 83.4 fL (ref 78.0–100.0)
MONOS PCT: 3 %
Monocytes Absolute: 0.5 10*3/uL (ref 0.1–1.0)
NEUTROS ABS: 14.4 10*3/uL — AB (ref 1.7–7.7)
Neutrophils Relative %: 80 %
Platelets: 461 10*3/uL — ABNORMAL HIGH (ref 150–400)
RBC: 3.92 MIL/uL (ref 3.87–5.11)
RDW: 15.2 % (ref 11.5–15.5)
WBC: 16.4 10*3/uL — AB (ref 4.0–10.5)

## 2017-11-29 LAB — I-STAT CG4 LACTIC ACID, ED
LACTIC ACID, VENOUS: 2.25 mmol/L — AB (ref 0.5–1.9)
LACTIC ACID, VENOUS: 2.87 mmol/L — AB (ref 0.5–1.9)

## 2017-11-29 LAB — CBG MONITORING, ED: GLUCOSE-CAPILLARY: 93 mg/dL (ref 65–99)

## 2017-11-29 LAB — I-STAT CHEM 8, ED
BUN: 37 mg/dL — AB (ref 6–20)
CHLORIDE: 94 mmol/L — AB (ref 101–111)
Calcium, Ion: 1.3 mmol/L (ref 1.15–1.40)
Creatinine, Ser: 2.2 mg/dL — ABNORMAL HIGH (ref 0.44–1.00)
Glucose, Bld: 104 mg/dL — ABNORMAL HIGH (ref 65–99)
HEMATOCRIT: 35 % — AB (ref 36.0–46.0)
Hemoglobin: 11.9 g/dL — ABNORMAL LOW (ref 12.0–15.0)
POTASSIUM: 5.1 mmol/L (ref 3.5–5.1)
SODIUM: 125 mmol/L — AB (ref 135–145)
TCO2: 21 mmol/L — ABNORMAL LOW (ref 22–32)

## 2017-11-29 LAB — BLOOD GAS, ARTERIAL
Acid-base deficit: 12.6 mmol/L — ABNORMAL HIGH (ref 0.0–2.0)
BICARBONATE: 14.4 mmol/L — AB (ref 20.0–28.0)
Drawn by: 422461
FIO2: 60
LHR: 18 {breaths}/min
O2 SAT: 83.9 %
PATIENT TEMPERATURE: 39.4
PEEP: 5 cmH2O
PH ART: 7.153 — AB (ref 7.350–7.450)
VT: 400 mL
pCO2 arterial: 44.7 mmHg (ref 32.0–48.0)
pO2, Arterial: 74.4 mmHg — ABNORMAL LOW (ref 83.0–108.0)

## 2017-11-29 LAB — LACTIC ACID, PLASMA
LACTIC ACID, VENOUS: 2.1 mmol/L — AB (ref 0.5–1.9)
LACTIC ACID, VENOUS: 3.3 mmol/L — AB (ref 0.5–1.9)

## 2017-11-29 MED ORDER — SODIUM CHLORIDE 0.9 % IV SOLN
1.0000 g | INTRAVENOUS | Status: DC
  Administered 2017-11-29: 1 g via INTRAVENOUS
  Filled 2017-11-29 (×2): qty 10

## 2017-11-29 MED ORDER — PANTOPRAZOLE SODIUM 40 MG PO TBEC
40.0000 mg | DELAYED_RELEASE_TABLET | Freq: Every day | ORAL | Status: DC
  Filled 2017-11-29: qty 1

## 2017-11-29 MED ORDER — SODIUM CHLORIDE 0.9 % IV BOLUS (SEPSIS)
2000.0000 mL | Freq: Once | INTRAVENOUS | Status: AC
  Administered 2017-11-29: 2000 mL via INTRAVENOUS

## 2017-11-29 MED ORDER — VANCOMYCIN HCL 10 G IV SOLR
1500.0000 mg | Freq: Once | INTRAVENOUS | Status: AC
  Administered 2017-11-29: 1500 mg via INTRAVENOUS
  Filled 2017-11-29: qty 1500

## 2017-11-29 MED ORDER — ONDANSETRON HCL 4 MG/2ML IJ SOLN: 4.0000 mg | Freq: Four times a day (QID) | INTRAMUSCULAR | Status: DC | PRN

## 2017-11-29 MED ORDER — PROPOFOL 1000 MG/100ML IV EMUL
INTRAVENOUS | Status: AC
  Filled 2017-11-29: qty 100

## 2017-11-29 MED ORDER — VANCOMYCIN HCL IN DEXTROSE 1-5 GM/200ML-% IV SOLN
1000.0000 mg | Freq: Once | INTRAVENOUS | Status: AC
  Administered 2017-11-29: 1000 mg via INTRAVENOUS
  Filled 2017-11-29: qty 200

## 2017-11-29 MED ORDER — SODIUM CHLORIDE 0.9 % IV BOLUS (SEPSIS)
500.0000 mL | Freq: Once | INTRAVENOUS | Status: AC
  Administered 2017-11-29: 500 mL via INTRAVENOUS

## 2017-11-29 MED ORDER — ORAL CARE MOUTH RINSE
15.0000 mL | Freq: Four times a day (QID) | OROMUCOSAL | Status: DC
  Administered 2017-11-30 – 2017-12-01 (×6): 15 mL via OROMUCOSAL

## 2017-11-29 MED ORDER — ALBUMIN HUMAN 5 % IV SOLN
25.0000 g | Freq: Once | INTRAVENOUS | Status: AC
  Administered 2017-11-29: 25 g via INTRAVENOUS
  Filled 2017-11-29: qty 500

## 2017-11-29 MED ORDER — SODIUM CHLORIDE 0.9 % IV SOLN
INTRAVENOUS | Status: DC
  Administered 2017-11-29: 20:00:00 via INTRAVENOUS

## 2017-11-29 MED ORDER — LEVOTHYROXINE SODIUM 100 MCG PO TABS
100.0000 ug | ORAL_TABLET | Freq: Every day | ORAL | Status: DC
  Administered 2017-11-30 – 2017-12-09 (×10): 100 ug via ORAL
  Filled 2017-11-29 (×10): qty 1

## 2017-11-29 MED ORDER — ACETAMINOPHEN 325 MG PO TABS: 650.0000 mg | ORAL_TABLET | Freq: Four times a day (QID) | ORAL | Status: DC | PRN

## 2017-11-29 MED ORDER — NOREPINEPHRINE 4 MG/250ML-% IV SOLN
0.0000 ug/min | INTRAVENOUS | Status: DC
  Filled 2017-11-29: qty 250

## 2017-11-29 MED ORDER — SODIUM CHLORIDE 0.9 % IV SOLN
250.0000 mL | INTRAVENOUS | Status: DC | PRN
  Administered 2017-11-29: 250 mL via INTRAVENOUS

## 2017-11-29 MED ORDER — PIPERACILLIN-TAZOBACTAM 3.375 G IVPB 30 MIN
3.3750 g | Freq: Once | INTRAVENOUS | Status: AC
  Administered 2017-11-29: 3.375 g via INTRAVENOUS
  Filled 2017-11-29: qty 50

## 2017-11-29 MED ORDER — ROCURONIUM BROMIDE 50 MG/5ML IV SOLN
INTRAVENOUS | Status: AC | PRN
  Administered 2017-11-29: 50 mg via INTRAVENOUS

## 2017-11-29 MED ORDER — SODIUM CHLORIDE 0.9 % IV SOLN
0.0300 [IU]/min | INTRAVENOUS | Status: DC
  Administered 2017-11-29: 0.03 [IU]/min via INTRAVENOUS
  Filled 2017-11-29 (×4): qty 2

## 2017-11-29 MED ORDER — EPINEPHRINE PF 1 MG/ML IJ SOLN
INTRAMUSCULAR | Status: AC | PRN
  Administered 2017-11-29: 1 mg via INTRAVENOUS

## 2017-11-29 MED ORDER — ACETAMINOPHEN 650 MG RE SUPP
650.0000 mg | RECTAL | Status: AC | PRN
  Administered 2017-11-29 (×2): 650 mg via RECTAL
  Filled 2017-11-29: qty 1

## 2017-11-29 MED ORDER — NOREPINEPHRINE BITARTRATE 1 MG/ML IV SOLN
0.0000 ug/min | Freq: Once | INTRAVENOUS | Status: DC
  Filled 2017-11-29: qty 4

## 2017-11-29 MED ORDER — HYDROCORTISONE NA SUCCINATE PF 100 MG IJ SOLR
100.0000 mg | Freq: Four times a day (QID) | INTRAMUSCULAR | Status: DC
  Administered 2017-11-29 – 2017-12-01 (×7): 100 mg via INTRAVENOUS
  Filled 2017-11-29 (×6): qty 2

## 2017-11-29 MED ORDER — HEPARIN SODIUM (PORCINE) 5000 UNIT/ML IJ SOLN
5000.0000 [IU] | Freq: Three times a day (TID) | INTRAMUSCULAR | Status: DC
  Administered 2017-11-30 – 2017-12-09 (×28): 5000 [IU] via SUBCUTANEOUS
  Filled 2017-11-29 (×28): qty 1

## 2017-11-29 MED ORDER — CYCLOSPORINE 0.05 % OP EMUL
1.0000 [drp] | Freq: Every day | OPHTHALMIC | Status: DC
  Administered 2017-11-30 – 2017-12-08 (×9): 1 [drp] via OPHTHALMIC
  Filled 2017-11-29 (×12): qty 1

## 2017-11-29 MED ORDER — NOREPINEPHRINE BITARTRATE 1 MG/ML IV SOLN
0.0000 ug/min | INTRAVENOUS | Status: DC
  Administered 2017-11-29: 40 ug/min via INTRAVENOUS
  Administered 2017-11-29: 1.333 ug/min via INTRAVENOUS
  Administered 2017-11-30: 30 ug/min via INTRAVENOUS
  Administered 2017-11-30 (×2): 40 ug/min via INTRAVENOUS
  Filled 2017-11-29 (×8): qty 4

## 2017-11-29 MED ORDER — CHLORHEXIDINE GLUCONATE 0.12% ORAL RINSE (MEDLINE KIT)
15.0000 mL | Freq: Two times a day (BID) | OROMUCOSAL | Status: DC
  Administered 2017-11-30 – 2017-12-01 (×3): 15 mL via OROMUCOSAL

## 2017-11-29 MED ORDER — ONDANSETRON HCL 4 MG/2ML IJ SOLN
4.0000 mg | Freq: Once | INTRAMUSCULAR | Status: AC
  Administered 2017-11-29: 4 mg via INTRAVENOUS
  Filled 2017-11-29: qty 2

## 2017-11-29 MED ORDER — PANTOPRAZOLE SODIUM 40 MG IV SOLR
40.0000 mg | INTRAVENOUS | Status: DC
  Administered 2017-11-29 – 2017-12-08 (×10): 40 mg via INTRAVENOUS
  Filled 2017-11-29 (×10): qty 40

## 2017-11-29 MED ORDER — ETOMIDATE 2 MG/ML IV SOLN
INTRAVENOUS | Status: AC | PRN
  Administered 2017-11-29: 30 mg via INTRAVENOUS

## 2017-11-29 MED ORDER — POLYVINYL ALCOHOL 1.4 % OP SOLN
1.0000 [drp] | Freq: Every day | OPHTHALMIC | Status: DC
  Administered 2017-11-29 – 2017-12-09 (×11): 1 [drp] via OPHTHALMIC
  Filled 2017-11-29: qty 15

## 2017-11-29 NOTE — ED Notes (Signed)
Patient transported to X-ray 

## 2017-11-29 NOTE — H&P (Signed)
PULMONARY / CRITICAL CARE MEDICINE   Name: BRIANE BIRDEN MRN: [id number] DOB: Aug 09, 1941    ADMISSION DATE:  12/02/2017 CONSULTATION DATE:  12/21/2017  REFERRING MD:  Dr. Tyrone Nine / EDP   CHIEF COMPLAINT:  Hypotension   HISTORY OF PRESENT ILLNESS:   77 y/o F, former remote smoker (5 years, quit 50 years ago) who presented to [facility] ER on 3/6 after developing altered mental status at home.    The patient & patients family provide information.  They report she had a cold in December of 2018.  She reportedly recovered from the cold but the cough never improved.  In addition, she had back and leg pain that she was managing with ibuprofen.    She was evaluated by her PCP with a CXR which was abnormal.  She was given tramadol for her leg / back pain.  It did not relieve the pain and the family called 3/5 and asked if it was ok to give two pain pills at a time.  They were told it was ok to increase the pain medication.  To further evaluate abnormal CXR a CT of the chest was completed on 3/4 that demonstrated a 6.1 cm posterior RUL mass concerning for primary bronchogenic neoplasm, mass extends to the right perihilar region and abuts the right major fissure.  Mediastinal & right hildar nodal metastases.  Bilateral adrenal masses measuring up to 5.2 cm on the R worrisome for metastatic disease.  Scattered hepatic lesions measuring up to 39mm, indeterminate but worrisome for metastases. She was planned to see Dr. Earlie Server on 3/6 for malignant work up.  In the interim, she had been taking increased dosing of tramadol for pain.  On the am of 3/6 the patient attempted to get up and get ready for her planned visit with Dr. Julien Nordmann and was too weak to stand.  She had to be lowered to the floor.  The patient was lethargic but able to follow commands.    Since August 2018 the patient has lost 16 lbs.  She has had decreased appetite and nausea.  She denies night sweats, fevers, vomiting, shortness of breath, pain with  inspiration.  She does report chills.    On presentation, she was found to have soft blood pressures (94/41).  She was given 2.5L of NS. Initial labs - Na 125, K 5.1, Cl 94, glucose 104, BUN 37, Sr Cr 2.2, lactic acid 2.25,  WBC 16.4, hgb 11 and platelets 461.  CXR demonstrates known right suprahilar mass with slight worsening density in the RUL, LLL.  BP improved significantly with fluid administration.    PCCM called for evaluation.  PAST MEDICAL HISTORY :  She  has a past medical history of Carpal tunnel syndrome, Carpal tunnel syndrome, Diverticulosis, Fibrocystic breast disease, Fibromyalgia, HTN (hypertension), Hypothyroidism, IBS (irritable bowel syndrome), Macular degeneration, Migraine, PONV (postoperative nausea and vomiting), Reflux, and Sty.  PAST SURGICAL HISTORY: She  has a past surgical history that includes Replacement total knee (Right, 7 yrs ago); Cholecystectomy (9 yrs ago); thryoid surgery (20 yrs ago); arthroscopic knee surgery (Bilateral, yrs ago); Appendectomy; and Colonoscopy with propofol (N/A, 05/13/2014).  Allergies  Allergen Reactions  . Beta Adrenergic Blockers Other (See Comments)    Nightmares  . Codeine Nausea And Vomiting    severe  . Dexilant [Dexlansoprazole]     Rapid heart rate   . Erythromycin     Messed liver up  . Septra [Sulfamethoxazole-Trimethoprim]     Blisters  No current facility-administered medications on file prior to encounter.    Current Outpatient Medications on File Prior to Encounter  Medication Sig  . benazepril (LOTENSIN) 40 MG tablet Take 40 mg by mouth every morning.   Marland Kitchen esomeprazole (NEXIUM) 20 MG capsule Take 20 mg by mouth every evening.  Marland Kitchen levothyroxine (SYNTHROID, LEVOTHROID) 88 MCG tablet Take 88 mcg by mouth daily before breakfast.  . ondansetron (ZOFRAN) 8 MG tablet Take 8 mg by mouth every 8 (eight) hours as needed for nausea or vomiting.  . traMADol (ULTRAM) 50 MG tablet Take 50-100 mg by mouth every 6 (six) hours  as needed for moderate pain or severe pain.  Marland Kitchen triamterene-hydrochlorothiazide (MAXZIDE-25) 37.5-25 MG per tablet Take 1 tablet by mouth every morning.  Marland Kitchen ALPRAZolam (XANAX) 0.5 MG tablet Take 1-2 pills as needed on call to MRI.  Marland Kitchen amLODipine (NORVASC) 10 MG tablet Take 10 mg by mouth every morning.   Marland Kitchen aspirin EC 81 MG tablet Take 81 mg by mouth daily.  . Calcium Carb-Cholecalciferol (CALCIUM 600 + D) 600-200 MG-UNIT TABS Take 1 tablet by mouth 2 (two) times daily.  . fish oil-omega-3 fatty acids 1000 MG capsule Take 2 g by mouth daily.  . Homeopathic Products [facility] ALLERGY EYE RELIEF OP) Apply to eye 3 (three) times daily. To right eye  . Multiple Vitamins-Minerals (PRESERVISION/LUTEIN) CAPS Take 1 capsule by mouth 2 (two) times daily.  . nortriptyline (PAMELOR) 10 MG capsule Take 40 mg by mouth at bedtime.   Vladimir Faster Glycol-Propyl Glycol (SYSTANE ULTRA OP) Apply 4-5 drops to eye daily.  . RESTASIS MULTIDOSE 0.05 % ophthalmic emulsion 1 drop at bedtime.  . vitamin C (ASCORBIC ACID) 500 MG tablet Take 500 mg by mouth daily.    FAMILY HISTORY:  Her indicated that her mother is deceased. She indicated that her father is deceased.   SOCIAL HISTORY: She  reports that she quit smoking about 50 years ago. Her smoking use included cigarettes. She has a 0.50 pack-year smoking history. she has never used smokeless tobacco. She reports that she drinks about 1.0 oz of alcohol per week. She reports that she does not use drugs.  REVIEW OF SYSTEMS:  POSITIVES IN BOLD  Gen: Denies fever, chills, weight change, fatigue, night sweats HEENT: Denies blurred vision, double vision, hearing loss, tinnitus, sinus congestion, rhinorrhea, sore throat, neck stiffness, dysphagia PULM: Denies shortness of breath, cough, sputum production, hemoptysis, wheezing CV: Denies chest pain, edema, orthopnea, paroxysmal nocturnal dyspnea, palpitations GI: Denies abdominal pain, nausea, vomiting, diarrhea,  hematochezia, melena, constipation, change in bowel habits GU: Denies dysuria, hematuria, polyuria, oliguria, urethral discharge Endocrine: Denies hot or cold intolerance, polyuria, polyphagia or appetite change Derm: Denies rash, dry skin, scaling or peeling skin change Heme: Denies easy bruising, bleeding, bleeding gums Neuro: Denies headache, numbness, weakness, slurred speech, loss of memory or consciousness.  Back pain and bilateral lower extremity pain.   SUBJECTIVE:    VITAL SIGNS: BP (!) 84/57   Pulse 91   Temp 99.8 F (37.7 C) (Rectal)   Resp 20   Ht 5\' 5"  (1.651 m)   Wt 160 lb (72.6 kg)   SpO2 96%   BMI 26.63 kg/m   HEMODYNAMICS:    VENTILATOR SETTINGS:    INTAKE / OUTPUT: No intake/output data recorded.  PHYSICAL EXAMINATION: General: elderly female in NAD HEENT: MM pink/dry, no jvd  PSY: calm/appropriate  Neuro: drowsy, awakens / alert, oriented, follows commands, strength normal in all extremities / symmetrical CV: s1s2  rrr, no m/r/g PULM: even/non-labored, lungs bilaterally coarse  RJ:JOAC, non-tender, bsx4 active  Extremities: warm/dry, no edema  Skin: no rashes or lesions   LABS:  BMET Recent Labs  Lab 11/27/2017 1133 12/06/2017 1238  NA 124* 125*  K 4.8 5.1  CL 92* 94*  CO2 20*  --   BUN 41* 37*  CREATININE 2.22* 2.20*  GLUCOSE 110* 104*    Electrolytes Recent Labs  Lab 12/07/2017 1133  CALCIUM 9.8    CBC Recent Labs  Lab 12/04/2017 1133 11/28/2017 1238  WBC 16.4*  --   HGB 11.0* 11.9*  HCT 32.7* 35.0*  PLT 461*  --     Coag's No results for input(s): APTT, INR in the last 168 hours.  Sepsis Markers Recent Labs  Lab 12/18/2017 1238 11/28/2017 1429  LATICACIDVEN 2.25* 2.87*    ABG No results for input(s): PHART, PCO2ART, PO2ART in the last 168 hours.  Liver Enzymes Recent Labs  Lab 11/26/2017 1133  AST 38  ALT 17  ALKPHOS 98  BILITOT 1.1  ALBUMIN 3.1*    Cardiac Enzymes No results for input(s): TROPONINI, PROBNP in  the last 168 hours.  Glucose Recent Labs  Lab 11/26/2017 1327  GLUCAP 93    Imaging Dg Chest 2 View  Result Date: 12/22/2017 CLINICAL DATA:  The acute presentation with unresponsiveness. New diagnosis of lung cancer. EXAM: CHEST - 2 VIEW COMPARISON:  11/23/2017 FINDINGS: Heart size is normal. Right suprahilar mass is redemonstrated. Slight worsening of volume loss and/or infiltrate in the right upper lobe and left lower lobe. No sign of heart failure. IMPRESSION: Redemonstration of right suprahilar mass. Slight worsening of density in the right upper lobe and left lower lobe that could be atelectasis or mild pneumonia. Electronically Signed   By: Nelson Chimes M.D.   On: 12/04/2017 12:07   Ct Head Wo Contrast  Result Date: 12/01/2017 CLINICAL DATA:  Altered level of consciousness. EXAM: CT HEAD WITHOUT CONTRAST TECHNIQUE: Contiguous axial images were obtained from the base of the skull through the vertex without intravenous contrast. COMPARISON:  None. FINDINGS: Brain: Mild chronic ischemic white matter disease is noted. No mass effect or midline shift is noted. Ventricular size is within normal limits. There is no evidence of mass lesion, hemorrhage or acute infarction. Vascular: No hyperdense vessel or unexpected calcification. Skull: Normal. Negative for fracture or focal lesion. Sinuses/Orbits: No acute finding. Other: None. IMPRESSION: Mild chronic ischemic white matter disease. No acute intracranial abnormality seen. Electronically Signed   By: Marijo Conception, M.D.   On: 12/20/2017 13:55     STUDIES:  3/4  CT Chest >> 6.1 cm posterior RUL mass concerning for primary bronchogenic neoplasm, mass extends to the right perihilar region and abuts the right major fissure.  Mediastinal & right hildar nodal metastases.  Bilateral adrenal masses measuring up to 5.2 cm on the R worrisome for metastatic disease.  Scattered hepatic lesions measuring up to 88mm, indeterminate but worrisome for metastases.    Renal US 3/6 >>  CULTURES: BCx2 3/6 >>  UA 3/6 >> concern for UTI  UC 3/6 >>   ANTIBIOTICS: Vanco 3/6 x1 Zosyn 3/6 x1 Rocephin 3/7 >>   SIGNIFICANT EVENTS: 3/06  Admit   LINES/TUBES:   DISCUSSION: 77 y/o F, former remote smoker (50 years ago), admitted with hypotension and altered mental status.  Concern for possible UTI. Hypovolemia, AKI.  Recent discovery of lung mass.    ASSESSMENT / PLAN:  PULMONARY A: Right Lung Mass -  new dx as of 3/4 Possible Post-Obstructive PNA  Former Remote Smoker  P:   O2 as needed for sats >90% Follow intermittent CXR  Suspected liver, adrenal mets > will need biopsy, consider IR liver guided vs outpatient PET first   CARDIOVASCULAR A:  Hypotension - resolved, likely multifactorial in setting of hypovolemia (HCTZ), narcotics, poor PO intake and possible UTI Hx HTN  P:  Hold home BP regimen > norvasc, benazepril, triamterene-HCTZ, ASA  Hold further narcotics for now  NS @ 125 ml/hr  Trend lactic acid   RENAL A:   AKI - suspect hypovolemia, NSAIDS, hypotension, ACE-I Hyponatremia  P:   Assess renal US with AKI Trend BMP / urinary output Replace electrolytes as indicated Avoid nephrotoxic agents, ensure adequate renal perfusion  GASTROINTESTINAL A:   Nausea  P:   PRN zofran  Diet as tolerated Continue home nexium    HEMATOLOGIC A:   Anemia  P:  Trend CBC  Heparin for DVT prophylaxis   INFECTIOUS A:   UTI  Severe Sepsis  Post-Obstructive PNA  P:   Follow cultures as above  De-escalate abx to community coverage Rocephin for UTI, pulmonary coverage   ENDOCRINE A:   Hypothyroidism   P:   Monitor glucose on BMP with poor intake  Continue synthroid   NEUROLOGIC A:   Pain - suspect secondary to metastatic disease   Depression  P:   PRN tylenol for pain  Hold home zoloft, xanax (PRN for MRI)  FAMILY  - Updates: Family and patient updated at bedside am 3/6.   - Inter-disciplinary family meet or  Palliative Care meeting due by:  3/12   Admit to SDU.  To [facility] for primary SVC as of 3/7 am.  PCCM will continue to follow for pulmonary consultation / mass work up.   Noe Gens, NP-C [facility] Pgr: [phone] or if no answer [phone] 12/23/2017, 2:55 PM

## 2017-11-29 NOTE — Telephone Encounter (Signed)
Oncology Nurse Navigator Documentation  Oncology Nurse Navigator Flowsheets 12/17/2017  Navigator Location [facility]  Navigator Encounter Type Telephone/per Dr. Julien Nordmann, I called to make Carol Powers's appt earlier.   Telephone Outgoing Call  Treatment Phase Abnormal Scans  Barriers/Navigation Needs Coordination of Care  Interventions Coordination of Care  Coordination of Care Appts  Acuity Level 2  Time Spent with Patient 15

## 2017-11-29 NOTE — ED Notes (Signed)
ED TO INPATIENT HANDOFF REPORT  Name/Age/Gender Carol Powers 77 y.o. female  Code Status    Code Status Orders  (From admission, onward)        Start     Ordered   12/19/2017 1557  Full code  Continuous     12/13/2017 1558    Code Status History    Date Active Date Inactive Code Status Order ID Comments User Context   This patient has a current code status but no historical code status.      Home/SNF/Other Home  Chief Complaint Hypotension  Level of Care/Admitting Diagnosis ED Disposition    ED Disposition Condition [facility] Area: [place] [[id number]]  Level of Care: Stepdown [14]  Admit to SDU based on following criteria: Hemodynamic compromise or significant risk of instability:  Patient requiring short term acute titration and management of vasoactive drips, and invasive monitoring (i.e., CVP and Arterial line).  Diagnosis: Hypotension [425956]  Admitting Physician: Marshell Garfinkel [[id number]]  Attending Physician: Marshell Garfinkel [[id number]]  Estimated length of stay: 3 - 4 days  Certification:: I certify this patient will need inpatient services for at least 2 midnights  PT Class (Do Not Modify): Inpatient [101]  PT Acc Code (Do Not Modify): Private [1]       Medical History Past Medical History:  Diagnosis Date  . Carpal tunnel syndrome   . Carpal tunnel syndrome   . Diverticulosis   . Fibrocystic breast disease   . Fibromyalgia   . HTN (hypertension)   . Hypothyroidism   . IBS (irritable bowel syndrome)   . Macular degeneration   . Migraine   . PONV (postoperative nausea and vomiting)   . Reflux   . Sty    right eye last few days, no drainage    Allergies Allergies  Allergen Reactions  . Beta Adrenergic Blockers Other (See Comments)    Nightmares  . Codeine Nausea And Vomiting    severe  . Dexilant [Dexlansoprazole]     Rapid heart rate   . Erythromycin     Messed liver up  . Septra  [Sulfamethoxazole-Trimethoprim]     Blisters     IV Location/Drains/Wounds Patient Lines/Drains/Airways Status   Active Line/Drains/Airways    Name:   Placement date:   Placement time:   Site:   Days:   Peripheral IV 12/22/2017 Right Antecubital   12/06/2017    1126    Antecubital   less than 1   Peripheral IV 12/13/2017 Right Hand   12/08/2017    1234    Hand   less than 1   Peripheral IV 12/20/2017 Left Forearm   12/05/2017    2030    Forearm   less than 1   NG/OG Tube Orogastric 16 Fr. Left mouth Xray Measured external length of tube   12/12/2017    2127    Left mouth   less than 1   Urethral Catheter Terri Doster RN Temperature probe 14 Fr.   12/05/2017    2129    Temperature probe   less than 1   Airway 7.5 mm   12/12/2017    2047     less than 1          Labs/Imaging Results for orders placed or performed during the hospital encounter of 12/08/2017 (from the past 48 hour(s))  Comprehensive metabolic panel     Status: Abnormal   Collection Time: 12/06/2017 11:33 AM  Result Value  Ref Range   Sodium 124 (L) 135 - 145 mmol/L   Potassium 4.8 3.5 - 5.1 mmol/L   Chloride 92 (L) 101 - 111 mmol/L   CO2 20 (L) 22 - 32 mmol/L   Glucose, Bld 110 (H) 65 - 99 mg/dL   BUN 41 (H) 6 - 20 mg/dL   Creatinine, Ser 2.22 (H) 0.44 - 1.00 mg/dL   Calcium 9.8 8.9 - 10.3 mg/dL   Total Protein 6.8 6.5 - 8.1 g/dL   Albumin 3.1 (L) 3.5 - 5.0 g/dL   AST 38 15 - 41 U/L   ALT 17 14 - 54 U/L   Alkaline Phosphatase 98 38 - 126 U/L   Total Bilirubin 1.1 0.3 - 1.2 mg/dL   GFR calc non Af Amer 20 (L) >60 mL/min   GFR calc Af Amer 24 (L) >60 mL/min    Comment: (NOTE) The eGFR has been calculated using the CKD EPI equation. This calculation has not been validated in all clinical situations. eGFR's persistently <60 mL/min signify possible Chronic Kidney Disease.    Anion gap 12 5 - 15    Comment: Performed at [facility], [place] [address], [place], [place] [address]  CBC WITH DIFFERENTIAL     Status:  Abnormal   Collection Time: 11/28/2017 11:33 AM  Result Value Ref Range   WBC 16.4 (H) 4.0 - 10.5 K/uL   RBC 3.92 3.87 - 5.11 MIL/uL   Hemoglobin 11.0 (L) 12.0 - 15.0 g/dL   HCT 32.7 (L) 36.0 - 46.0 %   MCV 83.4 78.0 - 100.0 fL   MCH 28.1 26.0 - 34.0 pg   MCHC 33.6 30.0 - 36.0 g/dL   RDW 15.2 11.5 - 15.5 %   Platelets 461 (H) 150 - 400 K/uL   Neutrophils Relative % 80 %   Lymphocytes Relative 8 %   Monocytes Relative 3 %   Eosinophils Relative 0 %   Basophils Relative 1 %   Band Neutrophils 8 %   Neutro Abs 14.4 (H) 1.7 - 7.7 K/uL   Lymphs Abs 1.3 0.7 - 4.0 K/uL   Monocytes Absolute 0.5 0.1 - 1.0 K/uL   Eosinophils Absolute 0.0 0.0 - 0.7 K/uL   Basophils Absolute 0.2 (H) 0.0 - 0.1 K/uL   WBC Morphology TOXIC GRANULATION     Comment: Performed at [facility], [place] [address], [place], [place] [address]  I-Stat CG4 Lactic Acid, ED  (not at  [facility])     Status: Abnormal   Collection Time: 12/07/2017 12:38 PM  Result Value Ref Range   Lactic Acid, Venous 2.25 (HH) 0.5 - 1.9 mmol/L   Comment NOTIFIED PHYSICIAN   I-Stat Chem 8, ED     Status: Abnormal   Collection Time: 11/24/2017 12:38 PM  Result Value Ref Range   Sodium 125 (L) 135 - 145 mmol/L   Potassium 5.1 3.5 - 5.1 mmol/L   Chloride 94 (L) 101 - 111 mmol/L   BUN 37 (H) 6 - 20 mg/dL   Creatinine, Ser 2.20 (H) 0.44 - 1.00 mg/dL   Glucose, Bld 104 (H) 65 - 99 mg/dL   Calcium, Ion 1.30 1.15 - 1.40 mmol/L   TCO2 21 (L) 22 - 32 mmol/L   Hemoglobin 11.9 (L) 12.0 - 15.0 g/dL   HCT 35.0 (L) 36.0 - 46.0 %  Urinalysis, Routine w reflex microscopic (not at [facility])     Status: Abnormal   Collection Time: 12/17/2017  1:21 PM  Result Value  Ref Range   Color, Urine YELLOW YELLOW   APPearance TURBID (A) CLEAR   Specific Gravity, Urine 1.014 1.005 - 1.030   pH 5.0 5.0 - 8.0   Glucose, UA NEGATIVE NEGATIVE mg/dL   Hgb urine dipstick SMALL (A) NEGATIVE   Bilirubin Urine NEGATIVE NEGATIVE   Ketones, ur NEGATIVE NEGATIVE mg/dL    Protein, ur 100 (A) NEGATIVE mg/dL   Nitrite NEGATIVE NEGATIVE   Leukocytes, UA LARGE (A) NEGATIVE   RBC / HPF 6-30 0 - 5 RBC/hpf   WBC, UA TOO NUMEROUS TO COUNT 0 - 5 WBC/hpf   Bacteria, UA MANY (A) NONE SEEN   Squamous Epithelial / LPF 0-5 (A) NONE SEEN   WBC Clumps PRESENT    Mucus PRESENT    Hyaline Casts, UA PRESENT     Comment: Performed at [facility], [place] [address], [place], [place] [address]  CBG monitoring, ED     Status: None   Collection Time: 11/25/2017  1:27 PM  Result Value Ref Range   Glucose-Capillary 93 65 - 99 mg/dL  I-Stat CG4 Lactic Acid, ED  (not at  [facility])     Status: Abnormal   Collection Time: 11/28/2017  2:29 PM  Result Value Ref Range   Lactic Acid, Venous 2.87 (HH) 0.5 - 1.9 mmol/L   Comment NOTIFIED PHYSICIAN   Lactic acid, plasma     Status: Abnormal   Collection Time: 12/11/2017  8:07 PM  Result Value Ref Range   Lactic Acid, Venous 2.1 (HH) 0.5 - 1.9 mmol/L    Comment: CRITICAL RESULT CALLED TO, READ BACK BY AND VERIFIED WITHDolores Hoose [id number] @ 2116 BY J SCOTTON Performed at [facility] [address], [place], [place] [address]    Dg Chest 2 View  Result Date: 11/28/2017 CLINICAL DATA:  The acute presentation with unresponsiveness. New diagnosis of lung cancer. EXAM: CHEST - 2 VIEW COMPARISON:  11/23/2017 FINDINGS: Heart size is normal. Right suprahilar mass is redemonstrated. Slight worsening of volume loss and/or infiltrate in the right upper lobe and left lower lobe. No sign of heart failure. IMPRESSION: Redemonstration of right suprahilar mass. Slight worsening of density in the right upper lobe and left lower lobe that could be atelectasis or mild pneumonia. Electronically Signed   By: Nelson Chimes M.D.   On: 12/22/2017 12:07   Dg Abdomen 1 View  Result Date: 12/08/2017 CLINICAL DATA:  OG tube placement. EXAM: ABDOMEN - 1 VIEW COMPARISON:  None. FINDINGS: Tip and side port of the enteric tube below the  diaphragm in the stomach. Cholecystectomy clips in the right upper quadrant. Probable ingested material within bowel loops in the central abdomen. IMPRESSION: Tip and side port of the enteric tube below the diaphragm in the stomach. Electronically Signed   By: Jeb Levering M.D.   On: 12/01/2017 22:02   Ct Head Wo Contrast  Result Date: 12/12/2017 CLINICAL DATA:  Altered level of consciousness. EXAM: CT HEAD WITHOUT CONTRAST TECHNIQUE: Contiguous axial images were obtained from the base of the skull through the vertex without intravenous contrast. COMPARISON:  None. FINDINGS: Brain: Mild chronic ischemic white matter disease is noted. No mass effect or midline shift is noted. Ventricular size is within normal limits. There is no evidence of mass lesion, hemorrhage or acute infarction. Vascular: No hyperdense vessel or unexpected calcification. Skull: Normal. Negative for fracture or focal lesion. Sinuses/Orbits: No acute finding. Other: None. IMPRESSION: Mild chronic ischemic white matter disease. No acute intracranial abnormality  seen. Electronically Signed   By: Marijo Conception, M.D.   On: 12/16/2017 13:55   US Renal  Result Date: 11/26/2017 CLINICAL DATA:  77 y/o  F; acute kidney injury. EXAM: RENAL / URINARY TRACT ULTRASOUND COMPLETE COMPARISON:  None. FINDINGS: Right Kidney: Length: 10.5 cm. Echogenicity within normal limits. No mass or hydronephrosis visualized. Left Kidney: Length: 10.2 cm. Echogenicity within normal limits. No mass or hydronephrosis visualized. Bladder: Appears normal for degree of bladder distention. IMPRESSION: Normal renal ultrasound. Electronically Signed   By: Kristine Garbe M.D.   On: 12/10/2017 19:19   Dg Chest Port 1 View  Result Date: 12/03/2017 CLINICAL DATA:  Status post intubation EXAM: PORTABLE CHEST 1 VIEW COMPARISON:  12/08/2017 FINDINGS: Cardiac shadow is stable. Endotracheal tube and nasogastric catheter are noted in satisfactory position. Persistent  right upper lobe mass lesion is again seen and stable. The overall inspiratory effort is poor. No bony abnormality is seen. IMPRESSION: Tubes and lines as described above. Stable right upper lobe mass. Electronically Signed   By: Inez Catalina M.D.   On: 12/23/2017 22:00    Pending Labs Unresulted Labs (From admission, onward)   Start     Ordered   11/30/17 0500  CBC  Tomorrow morning,   R     12/21/2017 1558   11/30/17 1275  Basic metabolic panel  Tomorrow morning,   R     12/17/2017 1558   11/30/17 0500  Magnesium  Tomorrow morning,   R     12/12/2017 1558   11/30/17 0500  Phosphorus  Tomorrow morning,   R     11/28/2017 1558   11/28/2017 2149  Draw ABG 1 hour after initiation of ventilator  STAT,   R    Question:  Room air or oxygen  Answer:  Oxygen   12/23/2017 2149   12/06/2017 2133  Strep pneumoniae urinary antigen  STAT,   R     12/03/2017 2134   12/22/2017 2133  Legionella, DFA (w/out culture)  Once,   R     11/28/2017 2134   11/27/2017 2042  Cortisol  Once,   R     12/12/2017 2041   11/24/2017 1800  Lactic acid, plasma  STAT Now then every 3 hours,   R     12/08/2017 1603   12/02/2017 1133  Blood Culture (routine x 2)  BLOOD CULTURE X 2,   STAT     12/22/2017 1132   11/27/2017 1133  Urine culture  STAT,   STAT     12/17/2017 1132      Vitals/Pain Today's Vitals   12/17/2017 2155 12/14/2017 2200 12/20/2017 2205 12/01/2017 2210  BP: (!) 87/40 (!) 84/43 (!) 82/35 (!) 84/36  Pulse:      Resp: (!) 7 (!) 0 16 12  Temp: (!) 103.6 F (39.8 C) (!) 103.5 F (39.7 C) (!) 103.3 F (39.6 C) (!) 103.1 F (39.5 C)  TempSrc:      SpO2: 100%  100% 97%  Weight:      Height:      PainSc:        Isolation Precautions No active isolations  Medications Medications  cefTRIAXone (ROCEPHIN) 1 g in sodium chloride 0.9 % 100 mL IVPB (1 g Intravenous New Bag/Given 12/04/2017 2215)  0.9 %  sodium chloride infusion (not administered)  heparin injection 5,000 Units (not administered)  0.9 %  sodium chloride infusion (  Intravenous New Bag/Given 12/08/2017 2008)  ondansetron (ZOFRAN) injection 4 mg (not  administered)  levothyroxine (SYNTHROID, LEVOTHROID) tablet 100 mcg (not administered)  polyvinyl alcohol (LIQUIFILM TEARS) 1.4 % ophthalmic solution 1 drop (not administered)  cycloSPORINE (RESTASIS) 0.05 % ophthalmic emulsion 1 drop (not administered)  pantoprazole (PROTONIX) injection 40 mg (not administered)  norepinephrine (LEVOPHED) 4 mg in dextrose 5 % 250 mL (0.016 mg/mL) infusion (18.667 mcg/min Intravenous Rate/Dose Change 12/21/2017 2211)  propofol (DIPRIVAN) 1000 MG/100ML infusion (10 mcg/min  Rate/Dose Change 12/16/2017 2131)  hydrocortisone sodium succinate (SOLU-CORTEF) 100 MG injection 100 mg (100 mg Intravenous Given 11/26/2017 2203)  chlorhexidine gluconate (MEDLINE KIT) (PERIDEX) 0.12 % solution 15 mL (not administered)  MEDLINE mouth rinse (not administered)  sodium chloride 0.9 % bolus 2,000 mL (0 mLs Intravenous Stopped 11/28/2017 1654)  ondansetron (ZOFRAN) injection 4 mg (4 mg Intravenous Given 12/24/2017 1145)  vancomycin (VANCOCIN) 1,500 mg in sodium chloride 0.9 % 500 mL IVPB (0 mg Intravenous Stopped 12/11/2017 1653)  piperacillin-tazobactam (ZOSYN) IVPB 3.375 g (0 g Intravenous Stopped 12/05/2017 1354)  sodium chloride 0.9 % bolus 500 mL (0 mLs Intravenous Stopped 12/11/2017 1655)  acetaminophen (TYLENOL) suppository 650 mg (650 mg Rectal Given by Other 12/08/2017 2108)  etomidate (AMIDATE) injection (30 mg Intravenous Given 11/28/2017 2045)  rocuronium (ZEMURON) injection (50 mg Intravenous Given 11/28/2017 2046)  EPINEPHrine (ADRENALIN) (1 mg Intravenous Given 12/20/2017 2037)    Mobility non-ambulatory (For visit) Ambulatory: before visit.   Gave report to [facility] RN [phone]

## 2017-11-29 NOTE — ED Notes (Signed)
Family at bedside/patient is sedated

## 2017-11-29 NOTE — Telephone Encounter (Signed)
Cancelled appt with [facility]. Pt rushed to [facility] ED via EMS-

## 2017-11-29 NOTE — ED Notes (Signed)
Date and time results received: 12/10/2017 2115 (use smartphrase ".now" to insert current time)  Test: Lactic Acid  Critical Value: 2.1   Name of Provider Notified: Dr. Sherry Ruffing, he was present in the room when lab valve given.   Orders Received? Or Actions Taken?: Actions Taken: Awaiting new orders

## 2017-11-29 NOTE — ED Notes (Signed)
Patient remains sedated-MP ST 110 with BBB/titrating levophed/propofol at 22.96 mcg/kg/min-family at bedside

## 2017-11-29 NOTE — ED Provider Notes (Signed)
Procedure Name: Intubation Date/Time: 12/10/2017 8:53 PM Performed by: Lorin Glass, PA-C Pre-anesthesia Checklist: Patient identified, Emergency Drugs available, Suction available, Patient being monitored and Timeout performed Oxygen Delivery Method: Ambu bag Preoxygenation: Pre-oxygenation with 100% oxygen Induction Type: Rapid sequence Ventilation: Mask ventilation without difficulty Laryngoscope Size: Glidescope Grade View: Grade I Tube size: 7.5 mm Number of attempts: 1 Airway Equipment and Method: Video-laryngoscopy Placement Confirmation: ETT inserted through vocal cords under direct vision,  Positive ETCO2 and Breath sounds checked- equal and bilateral Secured at: 23 cm Tube secured with: ETT holder Dental Injury: Teeth and Oropharynx as per pre-operative assessment       Procedure performed under direct supervision of Dr. Sherry Ruffing who was available to assist if needed.    Lorin Glass, PA-C 12/04/2017 2149    Tegeler, Gwenyth Allegra, MD 12/06/2017 [phone]

## 2017-11-29 NOTE — ED Notes (Signed)
Requested IV Ultrasound for second IV

## 2017-11-29 NOTE — ED Provider Notes (Signed)
[facility] DEPT Provider Note   CSN: [id number] Arrival date & time: 12/05/2017  1101     History   Chief Complaint No chief complaint on file.   HPI Carol Powers is a 77 y.o. female.  77 yo F with a chief complaints of weakness and change in mental status.  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  She has been on tramadol at home and has been taking an increased amount of it due to the pain.  They have been taking 2 pills at home instead of 1 after discussion with her doctor.  They noticed that she had decreased mentation and had decreased p.o. intake over the past 48 hours and so stopped yesterday.  This morning she was very hard to arouse and so they called 911.  She was hypotensive in the field and was given 700 cc of IV fluid with some improvement of her blood pressure.  The patient is a bit slow to respond she is unsure why she is here.  She does not have any specific complaints other than she has not had a bowel movement in 3 or 4 days.  She states she feels like she needs to have a bowel movement.  Denies overt abdominal pain.  Denies nausea or vomiting.  Has a history of a oophorectomy.    The history is provided by the patient.  Illness  This is a new problem. The current episode started yesterday. The problem occurs constantly. The problem has not changed since onset.Pertinent negatives include no chest pain, no headaches and no shortness of breath. Nothing aggravates the symptoms. Nothing relieves the symptoms. She has tried nothing for the symptoms. The treatment provided no relief.    Past Medical History:  Diagnosis Date  . Carpal tunnel syndrome   . Carpal tunnel syndrome   . Diverticulosis   . Fibrocystic breast disease   . Fibromyalgia   . HTN (hypertension)   . Hypothyroidism   . IBS (irritable bowel syndrome)   . Macular degeneration   . Migraine   . PONV (postoperative nausea and  vomiting)   . Reflux   . Sty    right eye last few days, no drainage    There are no active problems to display for this patient.   Past Surgical History:  Procedure Laterality Date  . APPENDECTOMY    . arthroscopic knee surgery Bilateral yrs ago  . CHOLECYSTECTOMY  9 yrs ago  . COLONOSCOPY WITH PROPOFOL N/A 05/13/2014   Procedure: COLONOSCOPY WITH PROPOFOL;  Surgeon: Garlan Fair, MD;  Location: [facility];  Service: Endoscopy;  Laterality: N/A;  . REPLACEMENT TOTAL KNEE Right 7 yrs ago  . thryoid surgery  20 yrs ago   partial    OB History    No data available       Home Medications    Prior to Admission medications   Medication Sig Start Date End Date Taking? Authorizing Provider  benazepril (LOTENSIN) 40 MG tablet Take 40 mg by mouth every morning.    Yes [provider]  esomeprazole (NEXIUM) 20 MG capsule Take 20 mg by mouth every evening.   Yes [provider]  levothyroxine (SYNTHROID, LEVOTHROID) 88 MCG tablet Take 88 mcg by mouth daily before breakfast.   Yes [provider]  ondansetron (ZOFRAN) 8 MG tablet Take 8 mg by mouth every 8 (eight) hours as needed for nausea or vomiting.   Yes  [provider]  traMADol (ULTRAM) 50 MG tablet Take 50-100 mg by mouth every 6 (six) hours as needed for moderate pain or severe pain.   Yes [provider]  triamterene-hydrochlorothiazide (MAXZIDE-25) 37.5-25 MG per tablet Take 1 tablet by mouth every morning. 03/17/13  Yes [provider]  ALPRAZolam Duanne Moron) 0.5 MG tablet Take 1-2 pills as needed on call to MRI. 06/05/17   Star Age, MD  amLODipine (NORVASC) 10 MG tablet Take 10 mg by mouth every morning.     [provider]  aspirin EC 81 MG tablet Take 81 mg by mouth daily.    [provider]  Calcium Carb-Cholecalciferol (CALCIUM 600 + D) 600-200 MG-UNIT TABS Take 1 tablet by mouth 2 (two) times daily.    [provider]  fish oil-omega-3  fatty acids 1000 MG capsule Take 2 g by mouth daily.    [provider]  fluticasone (FLONASE) 50 MCG/ACT nasal spray Place 2 sprays into both nostrils daily. 08/26/17   [provider]  Homeopathic Products ([place] OP) Apply to eye 3 (three) times daily. To right eye    [provider]  levothyroxine (SYNTHROID, LEVOTHROID) 100 MCG tablet TAKE 1 TABLET BY MOUTH EVERY MORNING ON AN EMPTY STOMACH 11/22/17   [provider]  Multiple Vitamins-Minerals (PRESERVISION/LUTEIN) CAPS Take 1 capsule by mouth 2 (two) times daily.    [provider]  nortriptyline (PAMELOR) 10 MG capsule Take 40 mg by mouth at bedtime.  05/14/13   [provider]  Polyethyl Glycol-Propyl Glycol (SYSTANE ULTRA OP) Apply 4-5 drops to eye daily.    [provider]  RESTASIS MULTIDOSE 0.05 % ophthalmic emulsion 1 drop at bedtime. 05/03/17   [provider]  sertraline (ZOLOFT) 25 MG tablet Take 25 mg by mouth daily. 10/11/17   [provider]  VENTOLIN HFA 108 (90 Base) MCG/ACT inhaler Inhale 2 puffs into the lungs every 6 (six) hours as needed. 10/23/17   [provider]  vitamin C (ASCORBIC ACID) 500 MG tablet Take 500 mg by mouth daily.    [provider]    Family History Family History  Problem Relation Age of Onset  . Heart failure Father   . Cancer Mother     Social History Social History   Tobacco Use  . Smoking status: Former Smoker    Packs/day: 0.25    Years: 2.00    Pack years: 0.50    Types: Cigarettes    Last attempt to quit: 06/05/1967    Years since quitting: 50.5  . Smokeless tobacco: Never Used  Substance Use Topics  . Alcohol use: Yes    Alcohol/week: 1.0 oz    Types: 2 Standard drinks or equivalent per week  . Drug use: No     Allergies   Beta adrenergic blockers; Codeine; Dexilant [dexlansoprazole]; Erythromycin; and Septra [sulfamethoxazole-trimethoprim]   Review of  Systems Review of Systems  Constitutional: Negative for chills and fever.  HENT: Negative for congestion and rhinorrhea.   Eyes: Negative for redness and visual disturbance.  Respiratory: Negative for shortness of breath and wheezing.   Cardiovascular: Negative for chest pain and palpitations.  Gastrointestinal: Negative for nausea and vomiting.  Genitourinary: Negative for dysuria and urgency.  Musculoskeletal: Negative for arthralgias and myalgias.  Skin: Negative for pallor and wound.  Neurological: Negative for dizziness and headaches.     Physical Exam Updated Vital Signs BP 123/89   Pulse (!) 105   Temp  99.8 F (37.7 C) (Rectal)   Resp 19   Ht 5\' 5"  (1.651 m)   Wt 72.6 kg (160 lb)   SpO2 94%   BMI 26.63 kg/m   Physical Exam  Constitutional: She appears well-developed and well-nourished. No distress.  HENT:  Head: Normocephalic and atraumatic.  Eyes: EOM are normal. Pupils are equal, round, and reactive to light.  Neck: Normal range of motion. Neck supple.  Cardiovascular: Regular rhythm. Tachycardia present. Exam reveals no gallop and no friction rub.  No murmur heard. Pulmonary/Chest: Effort normal. She has no wheezes. She has no rales.  Abdominal: Soft. She exhibits no distension and no mass. There is no tenderness. There is no guarding.  Musculoskeletal: She exhibits no edema or tenderness.  Neurological: She is alert.  Eyes open to voice.  Grossly intact  Skin: Skin is warm and dry. She is not diaphoretic.  Psychiatric: She has a normal mood and affect. Her behavior is normal.  Nursing note and vitals reviewed.    ED Treatments / Results  Labs (all labs ordered are listed, but only abnormal results are displayed) Labs Reviewed  COMPREHENSIVE METABOLIC PANEL - Abnormal; Notable for the following components:      Result Value   Sodium 124 (*)    Chloride 92 (*)    CO2 20 (*)    Glucose, Bld 110 (*)    BUN 41 (*)    Creatinine, Ser 2.22 (*)    Albumin  3.1 (*)    GFR calc non Af Amer 20 (*)    GFR calc Af Amer 24 (*)    All other components within normal limits  CBC WITH DIFFERENTIAL/PLATELET - Abnormal; Notable for the following components:   WBC 16.4 (*)    Hemoglobin 11.0 (*)    HCT 32.7 (*)    Platelets 461 (*)    Neutro Abs 14.4 (*)    Basophils Absolute 0.2 (*)    All other components within normal limits  URINALYSIS, ROUTINE W REFLEX MICROSCOPIC - Abnormal; Notable for the following components:   APPearance TURBID (*)    Hgb urine dipstick SMALL (*)    Protein, ur 100 (*)    Leukocytes, UA LARGE (*)    Bacteria, UA MANY (*)    Squamous Epithelial / LPF 0-5 (*)    All other components within normal limits  I-STAT CG4 LACTIC ACID, ED - Abnormal; Notable for the following components:   Lactic Acid, Venous 2.25 (*)    All other components within normal limits  I-STAT CHEM 8, ED - Abnormal; Notable for the following components:   Sodium 125 (*)    Chloride 94 (*)    BUN 37 (*)    Creatinine, Ser 2.20 (*)    Glucose, Bld 104 (*)    TCO2 21 (*)    Hemoglobin 11.9 (*)    HCT 35.0 (*)    All other components within normal limits  I-STAT CG4 LACTIC ACID, ED - Abnormal; Notable for the following components:   Lactic Acid, Venous 2.87 (*)    All other components within normal limits  CULTURE, BLOOD (ROUTINE X 2)  CULTURE, BLOOD (ROUTINE X 2)  URINE CULTURE  CBG MONITORING, ED    EKG  EKG Interpretation  Date/Time:  Wednesday November 29 2017 12:51:57 EST Ventricular Rate:  94 PR Interval:    QRS Duration: 158 QT Interval:  379 QTC Calculation: 474 R Axis:   104 Text Interpretation:  Sinus rhythm Borderline prolonged PR  interval Left bundle branch block wided qrs since prior Otherwise no significant change Confirmed by Deno Etienne [phone]) on 12/23/2017 12:57:06 PM       Radiology Dg Chest 2 View  Result Date: 12/19/2017 CLINICAL DATA:  The acute presentation with unresponsiveness. New diagnosis of lung cancer. EXAM:  CHEST - 2 VIEW COMPARISON:  11/23/2017 FINDINGS: Heart size is normal. Right suprahilar mass is redemonstrated. Slight worsening of volume loss and/or infiltrate in the right upper lobe and left lower lobe. No sign of heart failure. IMPRESSION: Redemonstration of right suprahilar mass. Slight worsening of density in the right upper lobe and left lower lobe that could be atelectasis or mild pneumonia. Electronically Signed   By: Nelson Chimes M.D.   On: 12/17/2017 12:07   Ct Head Wo Contrast  Result Date: 12/14/2017 CLINICAL DATA:  Altered level of consciousness. EXAM: CT HEAD WITHOUT CONTRAST TECHNIQUE: Contiguous axial images were obtained from the base of the skull through the vertex without intravenous contrast. COMPARISON:  None. FINDINGS: Brain: Mild chronic ischemic white matter disease is noted. No mass effect or midline shift is noted. Ventricular size is within normal limits. There is no evidence of mass lesion, hemorrhage or acute infarction. Vascular: No hyperdense vessel or unexpected calcification. Skull: Normal. Negative for fracture or focal lesion. Sinuses/Orbits: No acute finding. Other: None. IMPRESSION: Mild chronic ischemic white matter disease. No acute intracranial abnormality seen. Electronically Signed   By: Marijo Conception, M.D.   On: 11/27/2017 13:55    Procedures Procedures (including critical care time)  Medications Ordered in ED Medications  cefTRIAXone (ROCEPHIN) 1 g in sodium chloride 0.9 % 100 mL IVPB (not administered)  sodium chloride 0.9 % bolus 2,000 mL (2,000 mLs Intravenous New Bag/Given 12/02/2017 1244)  ondansetron (ZOFRAN) injection 4 mg (4 mg Intravenous Given 12/04/2017 1145)  vancomycin (VANCOCIN) 1,500 mg in sodium chloride 0.9 % 500 mL IVPB (1,500 mg Intravenous New Bag/Given 12/21/2017 1245)  piperacillin-tazobactam (ZOSYN) IVPB 3.375 g (0 g Intravenous Stopped 12/17/2017 1354)  sodium chloride 0.9 % bolus 500 mL (500 mLs Intravenous New Bag/Given 11/28/2017 1301)      Initial Impression / Assessment and Plan / ED Course  I have reviewed the triage vital signs and the nursing notes.  Pertinent labs & imaging results that were available during my care of the patient were reviewed by me and considered in my medical decision making (see chart for details).     77 yo F with a chief complaint of altered mental status and weakness.  The family seems to think that this is increased pain medicine.  Patient however is tachycardic and hypotensive which I feel is unlikely to be due to narcotic use.  She may just be volume depleted I will give 2 L of IV fluids.  Check labs chest x-ray urine.  Chest x-ray is concerning for pneumonia.  With the patient's hypotension I called code sepsis.  We will give broad-spectrum antibiotics.  30 cc/kg of IV fluids were ordered.  The patient has continued to get IV fluids.  Currently she has had 2-1/2 L in.  She is doing somewhat better clinically however her mean arterial pressures continue to be in the 50s.  Her systolic blood pressure is improved significantly however her diastolic is down in the 56E and 40s.  I discussed the case with critical care they recommended starting pressors and will come and evaluate the patient at bedside.  Patient was also noted to have urinary tract infection.  Sepsis -  Repeat Assessment  Performed at:    3:53 PM   Vitals     Blood pressure (!) 90/48, pulse 99, temperature 99.8 F (37.7 C), temperature source Rectal, resp. rate 19, height 5\' 5"  (1.651 m), weight 72.6 kg (160 lb), SpO2 96 %.  Heart:     Regular rate and rhythm  Lungs:    CTA  Capillary Refill:   <2 sec  Peripheral Pulse:   Radial pulse palpable  Skin:     Pale and Dry   CRITICAL CARE Performed by: Cecilio Asper   Total critical care time: 80 minutes  Critical care time was exclusive of separately billable procedures and treating other patients.  Critical care was necessary to treat or prevent imminent or  life-threatening deterioration.  Critical care was time spent personally by me on the following activities: development of treatment plan with patient and/or surrogate as well as nursing, discussions with consultants, evaluation of patient's response to treatment, examination of patient, obtaining history from patient or surrogate, ordering and performing treatments and interventions, ordering and review of laboratory studies, ordering and review of radiographic studies, pulse oximetry and re-evaluation of patient's condition.  Patient reassessed again with improved BP. Crit care to see.   The patients results and plan were reviewed and discussed.   Any x-rays performed were independently reviewed by myself.   Differential diagnosis were considered with the presenting HPI.  Medications  cefTRIAXone (ROCEPHIN) 1 g in sodium chloride 0.9 % 100 mL IVPB (not administered)  sodium chloride 0.9 % bolus 2,000 mL (2,000 mLs Intravenous New Bag/Given 12/14/2017 1244)  ondansetron (ZOFRAN) injection 4 mg (4 mg Intravenous Given 12/04/2017 1145)  vancomycin (VANCOCIN) 1,500 mg in sodium chloride 0.9 % 500 mL IVPB (1,500 mg Intravenous New Bag/Given 12/15/2017 1245)  piperacillin-tazobactam (ZOSYN) IVPB 3.375 g (0 g Intravenous Stopped 12/12/2017 1354)  sodium chloride 0.9 % bolus 500 mL (500 mLs Intravenous New Bag/Given 12/19/2017 1301)    Vitals:   11/27/2017 1415 12/15/2017 1445 12/19/2017 1515 11/27/2017 1530  BP: (!) 84/57 (!) 109/49 (!) 111/49 123/89  Pulse: 91 (!) 103 97 (!) 105  Resp: 20 18 (!) 26 19  Temp:      TempSrc:      SpO2:  91% 92% 94%  Weight:      Height:        Final diagnoses:  Acute kidney injury ([place])      Final Clinical Impressions(s) / ED Diagnoses   Final diagnoses:  Acute kidney injury [facility])    ED Discharge Orders    None       Deno Etienne, DO 12/06/2017 1553

## 2017-11-29 NOTE — ED Notes (Signed)
Ice packs to bilateral axilla and groin

## 2017-11-29 NOTE — ED Triage Notes (Signed)
Her family phoned EMS d/t pt. Being "unresponsive". Upon EMS [facility] they found pt. To be lethargic and able to follow simple commands. She arrives in E.D. In no distress and is lethargic and c/o nausea. She is quite pale. EMS also mentions pt. Received dx of "lung cancer" two days ago.

## 2017-11-29 NOTE — ED Notes (Signed)
Family at bedside. 

## 2017-11-29 NOTE — Progress Notes (Signed)
[facility] Progress Note Patient Name: Carol Powers DOB: 10-02-1940 MRN: [id number]   Date of Service  12/04/2017  HPI/Events of Note  Hypotension, bilateral adrenal metastatic invasion, hyponatremia and high normal K+  eICU Interventions  Solucortef 100 mg iv q 6 hrs        [name] [name] 12/04/2017, 9:42 PM

## 2017-11-29 NOTE — ED Provider Notes (Signed)
8:51 PM  Nursing came and requested my evaluation of a patient that is awaiting admission to the ICU for presumed sepsis.    On my initial evaluation, patient's blood pressure was in the 50s, she was gray, unresponsive, and had gurgling respirations.  Patient had a GCS of 3 and was not protecting her airway.  Next  Patient was quickly moved to a resuscitation room and intubation was performed by the emergency team without complication.  Patient was given epinephrine due to the hypotension prior to intubation which improved her blood pressure.  ICU team was called.  They agreed with pressors, fluids, and will broaden her antibiotics.  Discussion was held as to patient possibly needing LP.   Extensive discussion was held with family as to the course of the patient's care, current management strategies, and plan for admission.  9:36 PM On my reassessment just now, patient's blood pressure is in the 80s, I am concerned about her stability for lumbar puncture at this time.  Will continue to wait until blood pressure has been titrated up for procedure.  Patient is still awaiting bed.  Lactic acid has improved with increased fluids.  Critical care called and agreed with plan for moving patient upstairs for further blood pressure management prior to LP.  Patient had her pressors gradually increased but did not have any other new problems in the emergency department.   Patient taken upstairs with family for further management in the ICU.   CRITICAL CARE Performed by: Gwenyth Allegra Tegeler Total critical care time: 40 minutes Critical care time was exclusive of separately billable procedures and treating other patients. Critical care was necessary to treat or prevent imminent or life-threatening deterioration. Critical care was time spent personally by me on the following activities: development of treatment plan with patient and/or surrogate as well as nursing, discussions with consultants,  evaluation of patient's response to treatment, examination of patient, obtaining history from patient or surrogate, ordering and performing treatments and interventions, ordering and review of laboratory studies, ordering and review of radiographic studies, pulse oximetry and re-evaluation of patient's condition.    Tegeler, Gwenyth Allegra, MD 12/01/2017 2352

## 2017-11-29 NOTE — Progress Notes (Signed)
PHARMACY NOTE -  ANTIBIOTIC RENAL DOSE ADJUSTMENT   Request received for Pharmacy to assist with antibiotic renal dose adjustment.  Patient has been initiated on Ceftriaxone 1gm iv q24hr  for CAP. SCr 2.2, estimated CrCl 21 ml/min Current dosage is appropriate and need for further dosage adjustment appears unlikely at present. Will sign off at this time.  Please reconsult if a change in clinical status warrants re-evaluation of dosage.

## 2017-11-29 NOTE — ED Notes (Signed)
Called Critical Care for Dr Tegeler @2029 

## 2017-11-30 ENCOUNTER — Encounter: Payer: Self-pay | Admitting: *Deleted

## 2017-11-30 ENCOUNTER — Inpatient Hospital Stay (HOSPITAL_COMMUNITY): Payer: Medicare Other

## 2017-11-30 DIAGNOSIS — A419 Sepsis, unspecified organism: Principal | ICD-10-CM

## 2017-11-30 DIAGNOSIS — R6521 Severe sepsis with septic shock: Secondary | ICD-10-CM

## 2017-11-30 LAB — BASIC METABOLIC PANEL
Anion gap: 9 (ref 5–15)
BUN: 27 mg/dL — ABNORMAL HIGH (ref 6–20)
CHLORIDE: 106 mmol/L (ref 101–111)
CO2: 14 mmol/L — AB (ref 22–32)
Calcium: 7.7 mg/dL — ABNORMAL LOW (ref 8.9–10.3)
Creatinine, Ser: 1.36 mg/dL — ABNORMAL HIGH (ref 0.44–1.00)
GFR calc non Af Amer: 37 mL/min — ABNORMAL LOW (ref >60)
GFR, EST AFRICAN AMERICAN: 43 mL/min — AB (ref >60)
Glucose, Bld: 243 mg/dL — ABNORMAL HIGH (ref 65–99)
Potassium: 3.6 mmol/L (ref 3.5–5.1)
Sodium: 129 mmol/L — ABNORMAL LOW (ref 135–145)

## 2017-11-30 LAB — RESPIRATORY PANEL BY PCR
ADENOVIRUS-RVPPCR: NOT DETECTED
Bordetella pertussis: NOT DETECTED
CHLAMYDOPHILA PNEUMONIAE-RVPPCR: NOT DETECTED
CORONAVIRUS NL63-RVPPCR: NOT DETECTED
CORONAVIRUS OC43-RVPPCR: NOT DETECTED
Coronavirus 229E: NOT DETECTED
Coronavirus HKU1: NOT DETECTED
INFLUENZA A-RVPPCR: NOT DETECTED
Influenza B: NOT DETECTED
Metapneumovirus: NOT DETECTED
Mycoplasma pneumoniae: NOT DETECTED
PARAINFLUENZA VIRUS 2-RVPPCR: NOT DETECTED
PARAINFLUENZA VIRUS 3-RVPPCR: NOT DETECTED
PARAINFLUENZA VIRUS 4-RVPPCR: NOT DETECTED
Parainfluenza Virus 1: NOT DETECTED
Respiratory Syncytial Virus: NOT DETECTED
Rhinovirus / Enterovirus: NOT DETECTED

## 2017-11-30 LAB — BLOOD GAS, ARTERIAL
ACID-BASE DEFICIT: 13.1 mmol/L — AB (ref 0.0–2.0)
Acid-base deficit: 13.6 mmol/L — ABNORMAL HIGH (ref 0.0–2.0)
Bicarbonate: 12.1 mmol/L — ABNORMAL LOW (ref 20.0–28.0)
Bicarbonate: 12.1 mmol/L — ABNORMAL LOW (ref 20.0–28.0)
DRAWN BY: 232811
Drawn by: 232811
FIO2: 80
FIO2: 50
MECHVT: 400 mL
O2 SAT: 97.6 %
O2 Saturation: 97.5 %
PEEP/CPAP: 8 cmH2O
PEEP: 8 cmH2O
PH ART: 7.284 — AB (ref 7.350–7.450)
PO2 ART: 129 mmHg — AB (ref 83.0–108.0)
Patient temperature: 36.5
Patient temperature: 36.7
RATE: 20 resp/min
RATE: 20 resp/min
VT: 400 mL
pCO2 arterial: 28.1 mmHg — ABNORMAL LOW (ref 32.0–48.0)
pCO2 arterial: 26.2 mmHg — ABNORMAL LOW (ref 32.0–48.0)
pH, Arterial: 7.255 — ABNORMAL LOW (ref 7.350–7.450)
pO2, Arterial: 124 mmHg — ABNORMAL HIGH (ref 83.0–108.0)

## 2017-11-30 LAB — GLUCOSE, CAPILLARY
Glucose-Capillary: 147 mg/dL — ABNORMAL HIGH (ref 65–99)
Glucose-Capillary: 137 mg/dL — ABNORMAL HIGH (ref 65–99)
Glucose-Capillary: 110 mg/dL — ABNORMAL HIGH (ref 65–99)

## 2017-11-30 LAB — CORTISOL: CORTISOL PLASMA: 7.6 ug/dL

## 2017-11-30 LAB — CBC
HEMATOCRIT: 25.7 % — AB (ref 36.0–46.0)
HEMOGLOBIN: 8.5 g/dL — AB (ref 12.0–15.0)
MCH: 28.1 pg (ref 26.0–34.0)
MCHC: 33.1 g/dL (ref 30.0–36.0)
MCV: 85.1 fL (ref 78.0–100.0)
Platelets: 345 10*3/uL (ref 150–400)
RBC: 3.02 MIL/uL — AB (ref 3.87–5.11)
RDW: 15.4 % (ref 11.5–15.5)
WBC: 21.9 10*3/uL — ABNORMAL HIGH (ref 4.0–10.5)

## 2017-11-30 LAB — STREP PNEUMONIAE URINARY ANTIGEN: STREP PNEUMO URINARY ANTIGEN: NEGATIVE

## 2017-11-30 LAB — TRIGLYCERIDES: Triglycerides: 119 mg/dL (ref <150)

## 2017-11-30 LAB — PHOSPHORUS: PHOSPHORUS: 2.7 mg/dL (ref 2.5–4.6)

## 2017-11-30 LAB — MRSA PCR SCREENING: MRSA BY PCR: NEGATIVE

## 2017-11-30 LAB — MAGNESIUM: Magnesium: 1.4 mg/dL — ABNORMAL LOW (ref 1.7–2.4)

## 2017-11-30 MED ORDER — SODIUM CHLORIDE 0.9 % IV BOLUS (SEPSIS)
500.0000 mL | Freq: Once | INTRAVENOUS | Status: AC
  Administered 2017-11-30: 500 mL via INTRAVENOUS

## 2017-11-30 MED ORDER — LACTATED RINGERS IV SOLN
INTRAVENOUS | Status: DC
  Administered 2017-11-30 – 2017-12-07 (×3): via INTRAVENOUS

## 2017-11-30 MED ORDER — PROPOFOL 1000 MG/100ML IV EMUL
INTRAVENOUS | Status: AC
  Administered 2017-11-30: 15 ug/kg/min
  Filled 2017-11-30: qty 100

## 2017-11-30 MED ORDER — VITAL AF 1.2 CAL PO LIQD
1000.0000 mL | ORAL | Status: DC
  Administered 2017-11-30: 1000 mL
  Filled 2017-11-30 (×3): qty 1000

## 2017-11-30 MED ORDER — VITAL HIGH PROTEIN PO LIQD
1000.0000 mL | ORAL | Status: DC
  Filled 2017-11-30: qty 1000

## 2017-11-30 MED ORDER — NOREPINEPHRINE BITARTRATE 1 MG/ML IV SOLN
0.0000 ug/min | INTRAVENOUS | Status: DC
  Administered 2017-11-30: 22 ug/min via INTRAVENOUS
  Filled 2017-11-30: qty 16

## 2017-11-30 MED ORDER — PRO-STAT SUGAR FREE PO LIQD: 30.0000 mL | Freq: Two times a day (BID) | ORAL | Status: DC

## 2017-11-30 MED ORDER — SODIUM CHLORIDE 0.9 % IV SOLN
2.0000 g | INTRAVENOUS | Status: DC
  Administered 2017-11-30 – 2017-12-03 (×4): 2 g via INTRAVENOUS
  Filled 2017-11-30 (×4): qty 2

## 2017-11-30 MED ORDER — MAGNESIUM SULFATE 2 GM/50ML IV SOLN
2.0000 g | Freq: Once | INTRAVENOUS | Status: AC
  Administered 2017-11-30: 2 g via INTRAVENOUS
  Filled 2017-11-30: qty 50

## 2017-11-30 MED ORDER — INSULIN ASPART 100 UNIT/ML ~~LOC~~ SOLN
0.0000 [IU] | SUBCUTANEOUS | Status: DC
  Administered 2017-11-30: 1 [IU] via SUBCUTANEOUS
  Administered 2017-11-30: 2 [IU] via SUBCUTANEOUS
  Administered 2017-12-01 – 2017-12-07 (×8): 1 [IU] via SUBCUTANEOUS
  Administered 2017-12-07 – 2017-12-09 (×9): 2 [IU] via SUBCUTANEOUS
  Administered 2017-12-09 (×2): 1 [IU] via SUBCUTANEOUS

## 2017-11-30 MED ORDER — SODIUM CHLORIDE 0.9 % IV SOLN: INTRAVENOUS | Status: DC | PRN

## 2017-11-30 MED ORDER — ALBUMIN HUMAN 5 % IV SOLN
12.5000 g | Freq: Once | INTRAVENOUS | Status: AC
  Administered 2017-11-30: 12.5 g via INTRAVENOUS
  Filled 2017-11-30: qty 250

## 2017-11-30 MED ORDER — FENTANYL CITRATE (PF) 100 MCG/2ML IJ SOLN
25.0000 ug | INTRAMUSCULAR | Status: DC | PRN
Start: 2017-11-30 — End: 2017-12-04
  Administered 2017-11-30: 25 ug via INTRAVENOUS
  Filled 2017-11-30: qty 2

## 2017-11-30 MED ORDER — SODIUM CHLORIDE 0.9 % IV SOLN
100.0000 mg | Freq: Two times a day (BID) | INTRAVENOUS | Status: DC
  Administered 2017-11-30 – 2017-12-03 (×7): 100 mg via INTRAVENOUS
  Filled 2017-11-30 (×8): qty 100

## 2017-11-30 MED ORDER — FENTANYL CITRATE (PF) 100 MCG/2ML IJ SOLN
INTRAMUSCULAR | Status: AC
  Filled 2017-11-30: qty 2

## 2017-11-30 MED ORDER — PROPOFOL 1000 MG/100ML IV EMUL
5.0000 ug/kg/min | INTRAVENOUS | Status: DC
  Administered 2017-11-30 – 2017-12-01 (×3): 35 ug/kg/min via INTRAVENOUS
  Filled 2017-11-30: qty 100
  Filled 2017-11-30: qty 200
  Filled 2017-11-30 (×2): qty 100

## 2017-11-30 MED FILL — Medication: Qty: 1 | Status: AC

## 2017-11-30 NOTE — Progress Notes (Signed)
Pharmacy Brief Note   Pharmacy will concentrate norepinephrine infusion bag due to high rate of infusion.  Pharmacy changed order for norepinephrine 4 mg in 250 mL D5W (single strength) to 16 mg in 250 mL D5W (quadruple strength).  Pharmacy has communicated this change to RN and communicated need to adjust infusion pump settings for quad strength phenylephine.  RN Legrand Como) verbalized understanding.  Peggyann Juba, PharmD, BCPS 11/30/2017 7:59 AM

## 2017-11-30 NOTE — Procedures (Signed)
Arterial Catheter Insertion Procedure Note Carol Powers [id number] 07-Feb-1941  Procedure: Insertion of Arterial Catheter  Indications: Blood pressure monitoring  Procedure Details Consent: Risks of procedure as well as the alternatives and risks of each were explained to the (patient/caregiver).  Consent for procedure obtained. Time Out: Verified patient identification, verified procedure, site/side was marked, verified correct patient position, special equipment/implants available, medications/allergies/relevent history reviewed, required imaging and test results available.  Performed  Maximum sterile technique was used including antiseptics, cap, gloves, gown, hand hygiene, mask and sheet. Skin prep: Chlorhexidine; local anesthetic administered 20 gauge catheter was inserted into left radial artery using the Seldinger technique.  Evaluation Blood flow good; BP tracing good. Complications: No apparent complications.   Carol Powers P 11/30/2017

## 2017-11-30 NOTE — Progress Notes (Signed)
PULMONARY / CRITICAL CARE MEDICINE   Name: Carol Powers MRN: [id number] DOB: Nov 12, 1940    ADMISSION DATE:  12/19/2017 CONSULTATION DATE:  12/04/2017  REFERRING MD:  Dr. Tyrone Nine / EDP   CHIEF COMPLAINT:  Hypotension   HISTORY OF PRESENT ILLNESS:   77 y/o F, former remote smoker (5 years, quit 50 years ago) who presented to [facility] ER on 3/6 after developing altered mental status at home.    The patient & patients family provide information.  They report she had a cold in December of 2018.  She reportedly recovered from the cold but the cough never improved.  In addition, she had back and leg pain that she was managing with ibuprofen.    She was evaluated by her PCP with a CXR which was abnormal.  She was given tramadol for her leg / back pain.  It did not relieve the pain and the family called 3/5 and asked if it was ok to give two pain pills at a time.  They were told it was ok to increase the pain medication.  To further evaluate abnormal CXR a CT of the chest was completed on 3/4 that demonstrated a 6.1 cm posterior RUL mass concerning for primary bronchogenic neoplasm, mass extends to the right perihilar region and abuts the right major fissure.  Mediastinal & right hildar nodal metastases.  Bilateral adrenal masses measuring up to 5.2 cm on the R worrisome for metastatic disease.  Scattered hepatic lesions measuring up to 16mm, indeterminate but worrisome for metastases. She was planned to see Dr. Earlie Server on 3/6 for malignant work up.  In the interim, she had been taking increased dosing of tramadol for pain.  On the am of 3/6 the patient attempted to get up and get ready for her planned visit with Dr. Julien Nordmann and was too weak to stand.  She had to be lowered to the floor.  The patient was lethargic but able to follow commands.    Since August 2018 the patient has lost 16 lbs.  She has had decreased appetite and nausea.  She denies night sweats, fevers, vomiting, shortness of breath, pain with  inspiration.  She does report chills.    On presentation, she was found to have soft blood pressures (94/41).  She was given 2.5L of NS. Initial labs - Na 125, K 5.1, Cl 94, glucose 104, BUN 37, Sr Cr 2.2, lactic acid 2.25,  WBC 16.4, hgb 11 and platelets 461.  CXR demonstrates known right suprahilar mass with slight worsening density in the RUL, LLL.  BP improved significantly with fluid administration.    PCCM called for evaluation.    SUBJECTIVE:  RN reports pt decompensated overnight requiring vasopressors, intubation. Tmax to 105.  On levophed 58mcg (weaning), vasopressin and 10 mcg fentanyl.  CVP 10.  VITAL SIGNS: BP (!) 113/56   Pulse 98   Temp 98.1 F (36.7 C)   Resp (!) 25   Ht 5\' 1"  (1.549 m)   Wt 142 lb (64.4 kg)   SpO2 95%   BMI 26.83 kg/m   HEMODYNAMICS:    VENTILATOR SETTINGS: Vent Mode: PRVC FiO2 (%):  [40 %-100 %] 40 % Set Rate:  [16 bmp-20 bmp] 20 bmp Vt Set:  [400 mL-460 mL] 400 mL PEEP:  [5 cmH20-8 cmH20] 8 cmH20 Plateau Pressure:  [12 cmH20-16 cmH20] 12 cmH20  INTAKE / OUTPUT: I/O last 3 completed shifts: In: 19907.4 [I.V.:16657.4; IV Piggyback:3250] Out: 2875 [Urine:1850; Emesis/NG output:1000; Other:25]  PHYSICAL EXAMINATION:  General: critically ill appearing female lying in bed on vent   HEENT: MM pink/moist, ETT Neuro: awakens, nods, follows commands, denies pain CV: s1s2 rrr, no m/r/g PULM: even/non-labored, lungs bilaterally coarse  CB:JSEG, non-tender, bsx4 active  Extremities: warm/dry, no edema  Skin: no rashes or lesions  LABS:  BMET Recent Labs  Lab 12/21/2017 1133 12/18/2017 1238 11/30/17 0500  NA 124* 125* 129*  K 4.8 5.1 3.6  CL 92* 94* 106  CO2 20*  --  14*  BUN 41* 37* 27*  CREATININE 2.22* 2.20* 1.36*  GLUCOSE 110* 104* 243*    Electrolytes Recent Labs  Lab 12/12/2017 1133 11/30/17 0500  CALCIUM 9.8 7.7*  MG  --  1.4*  PHOS  --  2.7    CBC Recent Labs  Lab 11/26/2017 1133 11/26/2017 1238 11/30/17 0500  WBC  16.4*  --  21.9*  HGB 11.0* 11.9* 8.5*  HCT 32.7* 35.0* 25.7*  PLT 461*  --  345    Coag's No results for input(s): APTT, INR in the last 168 hours.  Sepsis Markers Recent Labs  Lab 11/30/2017 1429 12/20/2017 2007 12/08/2017 2144  LATICACIDVEN 2.87* 2.1* 3.3*    ABG Recent Labs  Lab 11/28/2017 2215 11/30/17 0240 11/30/17 0616  PHART 7.153* 7.255* 7.284*  PCO2ART 44.7 28.1* 26.2*  PO2ART 74.4* 129* 124*    Liver Enzymes Recent Labs  Lab 12/06/2017 1133  AST 38  ALT 17  ALKPHOS 98  BILITOT 1.1  ALBUMIN 3.1*    Cardiac Enzymes No results for input(s): TROPONINI, PROBNP in the last 168 hours.  Glucose Recent Labs  Lab 12/11/2017 1327  GLUCAP 93    Imaging Dg Chest 1 View  Result Date: 11/30/2017 CLINICAL DATA:  Pneumothorax.  Lung mass.  Intubated. EXAM: CHEST 1 VIEW COMPARISON:  Radiograph yesterday at 2038 hour.  CT 11/27/2017 FINDINGS: Endotracheal tube tip at the thoracic inlet. Tip and side port of the enteric tube below the diaphragm in the stomach. No visualized pneumothorax. Right suprahilar lung mass is unchanged from prior exam. Unchanged heart size and mediastinal contours. Developing left lung base opacity likely atelectasis and/or small effusion. IMPRESSION: 1. No evidence of pneumothorax. 2. Developing left lung base opacity may be combination of atelectasis and pleural fluid. 3. Right upper lobe lung mass unchanged from prior exams. Electronically Signed   By: Jeb Levering M.D.   On: 11/30/2017 03:57   Dg Chest 2 View  Result Date: 11/24/2017 CLINICAL DATA:  The acute presentation with unresponsiveness. New diagnosis of lung cancer. EXAM: CHEST - 2 VIEW COMPARISON:  11/23/2017 FINDINGS: Heart size is normal. Right suprahilar mass is redemonstrated. Slight worsening of volume loss and/or infiltrate in the right upper lobe and left lower lobe. No sign of heart failure. IMPRESSION: Redemonstration of right suprahilar mass. Slight worsening of density in the  right upper lobe and left lower lobe that could be atelectasis or mild pneumonia. Electronically Signed   By: Nelson Chimes M.D.   On: 12/02/2017 12:07   Dg Abdomen 1 View  Result Date: 12/06/2017 CLINICAL DATA:  OG tube placement. EXAM: ABDOMEN - 1 VIEW COMPARISON:  None. FINDINGS: Tip and side port of the enteric tube below the diaphragm in the stomach. Cholecystectomy clips in the right upper quadrant. Probable ingested material within bowel loops in the central abdomen. IMPRESSION: Tip and side port of the enteric tube below the diaphragm in the stomach. Electronically Signed   By: Jeb Levering M.D.   On: 12/17/2017 22:02  Ct Head Wo Contrast  Result Date: 12/21/2017 CLINICAL DATA:  Altered level of consciousness. EXAM: CT HEAD WITHOUT CONTRAST TECHNIQUE: Contiguous axial images were obtained from the base of the skull through the vertex without intravenous contrast. COMPARISON:  None. FINDINGS: Brain: Mild chronic ischemic white matter disease is noted. No mass effect or midline shift is noted. Ventricular size is within normal limits. There is no evidence of mass lesion, hemorrhage or acute infarction. Vascular: No hyperdense vessel or unexpected calcification. Skull: Normal. Negative for fracture or focal lesion. Sinuses/Orbits: No acute finding. Other: None. IMPRESSION: Mild chronic ischemic white matter disease. No acute intracranial abnormality seen. Electronically Signed   By: Marijo Conception, M.D.   On: 12/02/2017 13:55   US Renal  Result Date: 11/28/2017 CLINICAL DATA:  77 y/o  F; acute kidney injury. EXAM: RENAL / URINARY TRACT ULTRASOUND COMPLETE COMPARISON:  None. FINDINGS: Right Kidney: Length: 10.5 cm. Echogenicity within normal limits. No mass or hydronephrosis visualized. Left Kidney: Length: 10.2 cm. Echogenicity within normal limits. No mass or hydronephrosis visualized. Bladder: Appears normal for degree of bladder distention. IMPRESSION: Normal renal ultrasound. Electronically  Signed   By: Kristine Garbe M.D.   On: 12/03/2017 19:19   Dg Chest Port 1 View  Result Date: 12/20/2017 CLINICAL DATA:  Status post intubation EXAM: PORTABLE CHEST 1 VIEW COMPARISON:  12/22/2017 FINDINGS: Cardiac shadow is stable. Endotracheal tube and nasogastric catheter are noted in satisfactory position. Persistent right upper lobe mass lesion is again seen and stable. The overall inspiratory effort is poor. No bony abnormality is seen. IMPRESSION: Tubes and lines as described above. Stable right upper lobe mass. Electronically Signed   By: Inez Catalina M.D.   On: 11/28/2017 22:00     STUDIES:  3/4  CT Chest >> 6.1 cm posterior RUL mass concerning for primary bronchogenic neoplasm, mass extends to the right perihilar region and abuts the right major fissure.  Mediastinal & right hildar nodal metastases.  Bilateral adrenal masses measuring up to 5.2 cm on the R worrisome for metastatic disease.  Scattered hepatic lesions measuring up to 39mm, indeterminate but worrisome for metastases.   Renal US 3/6 >> normal renal US CT Head 3/6 >> mild chronic ischemic white matter disease. No acute abnormality.   CULTURES: BCx2 3/6 >>  UA 3/6 >> concern for UTI  UC 3/6 >>   ANTIBIOTICS: Vanco 3/6 x1 Zosyn 3/6 x1 Rocephin 3/7 >>  Azithro 3/7 >>   SIGNIFICANT EVENTS: 3/06  Admit, initially improved with IVF > decompensated late PM / intubated  3/07  Levophed 22 mcg, Vasopressin. CVP 10  LINES/TUBES:   DISCUSSION: 77 y/o F, former remote smoker (50 years ago), admitted with hypotension and altered mental status.  Concern for possible UTI. Hypovolemia, AKI.  Recent discovery of lung mass.    ASSESSMENT / PLAN:  PULMONARY A: Right Lung Mass - new dx as of 3/4 Possible Post-Obstructive PNA  Former Remote Smoker  P:   PRVC 8cc/kg  Wean PEEP / FiO2 for sats >90% Follow CXR  Daily SBT / WUA when hemodynamically stable   CARDIOVASCULAR A:  Septic Shock - in setting of UTI, LLL  infiltrate, additionally hypovolemia (HCTZ), narcotics, poor PO intake contributing  Hx HTN  P:  Levophed / Vasopressin for MAP >65 Stress dose steroids  NS @ 169ml/hr  Hold home Norvasc, Benazepril, Triamterene-HCTZ, ASA ICU monitoring  Trend CVP   RENAL A:   AKI - suspect sepsis, hypovolemia, NSAIDS, hypotension, ACE-I Hyponatremia  Metabolic Acidosis  P:   Trend BMP / urinary output Replace electrolytes as indicated Avoid nephrotoxic agents, ensure adequate renal perfusion Change IVF to LR @ 125 Aline monitoring   GASTROINTESTINAL A:   Nausea  P:   PRN zofran  NPO / OGT  Begin TF per Nutrition  Continue PPI   HEMATOLOGIC  A:   Anemia  P:  Trend CBC  Heparin for DVT prophylaxis   ONCOLOGIC A: 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    - Inter-disciplinary family meet or Palliative Care meeting due by:  3/12   Noe Gens, NP-C [facility] Pgr: [phone] or if no answer [phone] 11/30/2017, 8:20 AM

## 2017-11-30 NOTE — Progress Notes (Signed)
Oncology Nurse Navigator Documentation  Oncology Nurse Navigator Flowsheets 11/30/2017  Navigator Location [facility]  Navigator Encounter Type Other/Carol Powers was a no show for yesterday's appt.  Unfortunately, she was admitted to the hospital.  Will arrange follow up when discharged.   Treatment Phase Abnormal Scans  Barriers/Navigation Needs Coordination of Care  Interventions Coordination of Care  Acuity Level 1  Time Spent with Patient 15

## 2017-11-30 NOTE — Progress Notes (Signed)
Initial Nutrition Assessment  DOCUMENTATION CODES:   Not applicable  INTERVENTION:  - Will order Vital AF 1.2 @ 25 mL/hr advance by 10 mL every 4 hours to reach goal rate of Vital AF 1.2 @ 55 mL/hr. At goal rate, this regimen + kcal from current Propofol rate will provide 1914 kcal (103% estimated kcal need), 99 grams of protein, and 1070 mL free water.  - Free water flush per MD/NP.  Monitor magnesium, potassium, and phosphorus daily for at least 3 days, MD to replete as needed, as pt is at risk for refeeding syndrome given poor intakes for >/= 1 week.   NUTRITION DIAGNOSIS:   Inadequate oral intake related to inability to eat as evidenced by NPO status.  GOAL:   Patient will meet greater than or equal to 90% of their needs  MONITOR:   TF tolerance, Vent status, Weight trends, Labs  REASON FOR ASSESSMENT:   Ventilator, Consult Enteral/tube feeding initiation and management  ASSESSMENT:   77 year old former smoker with new diagnosis of right upper lobe mass concerning for cancer. Admitted with altered mental status, AKI, hypotension, likely sepsis from UTI, elevated lactic acid.  BMI indicates overweight status. Pt is intubated, sedated, with OGT in place. Per review of imaging report, "tip and side port of the enteric tube below the diaphragm in the stomach."   Pt's daughter was at bedside and provided information. She states that pt had informed her that since her physical at PCP office in August 2018 she has lost 16 lbs. Per chart review, pt hast lost 12 lbs (8% body weight) in the past 6 months. This is not significant for time frame. Daughter unable to determine if a certain amount of weight has been lost more acutely. She reports that appetite has slowly been decreasing since August and that it became very poor over the past 1 week with pt experiencing severe nausea 2/2 severe back and knee pain. Pt did not want to take pain medication for the past few days to a week because  of nausea. During the past week pt was also drinking very little.Pt denied abdominal pain PTA, had pain yesterday, but denied abdominal pain earlier this AM when asked by Velna Hatchet, PCCM NP.   Per Brandi's note this AM: pt had a CXR at PCP office which was abnormal and that and chest CT from 3/4 reviewed by care team and findings of 6.1 cm RUL mass was found and is concerning for primary bronchogenic neoplasm. Concern for metastatic disease (liver and adrenal involvement). Possible post-obstructive PNA, severe sepsis with septic shock in the setting of UTI, AKI.  Unable to identify malnutrition based on ASPEN guidelines. Will continue to monitor.    Patient is currently intubated on ventilator support MV: 8 L/min Temp (24hrs), Avg:101.4 F (38.6 C), Min:97.3 F (36.3 C), Max:105.1 F (40.6 C) Propofol: 12.5 ml/hr (330 kcal). BP: 104/54 and MAP: 72  Medications reviewed; Labs reviewed; Na: 129 mmol/L, BUN: 27 mg/dL, creatinine: 1.36 mg/dL, Ca: 7.1 mg/dL, Mg: 1.4 mg/dL, GFR: 37 mL/min.  IVF: NS @ 125 mL/hr. Drips: Vaso @ 0.03 units/min, Levo @ 10 mcg/min, Propofol @ 20 mcg/kg/min.    NUTRITION - FOCUSED PHYSICAL EXAM:  Completed/assessed with no muscle or fat wasting noted at this time; mild edema to BLE.  Diet Order:  Diet NPO time specified  EDUCATION NEEDS:   No education needs have been identified at this time  Skin:  Skin Assessment: Reviewed RN Assessment  Last BM:  PTA  Height:   Ht Readings from Last 1 Encounters:  12/02/2017 5\' 1"  (1.549 m)    Weight:   Wt Readings from Last 1 Encounters:  11/27/2017 142 lb (64.4 kg)    Ideal Body Weight:  47.73 kg  BMI:  Body mass index is 26.83 kg/m.  Estimated Nutritional Needs:   Kcal:  1850  Protein:  90-103 grams (1.4-1.6 grams/kg)  Fluid:  >/= 2 L/day      Jarome Matin, MS, RD, LDN, [facility] Inpatient Clinical Dietitian Pager # [phone] After hours/weekend pager # [phone]

## 2017-11-30 NOTE — Procedures (Signed)
Central Venous Catheter Insertion Procedure Note Carol Powers [id number] 11-Mar-1941  Procedure: Insertion of Central Venous Catheter Indications: Assessment of intravascular volume, Drug and/or fluid administration and Frequent blood sampling  Procedure Details Consent: Risks of procedure as well as the alternatives and risks of each were explained to the (patient/caregiver).  Consent for procedure obtained. Time Out: Verified patient identification, verified procedure, site/side was marked, verified correct patient position, special equipment/implants available, medications/allergies/relevent history reviewed, required imaging and test results available.  Performed  Maximum sterile technique was used including antiseptics, cap, gloves, gown, hand hygiene, mask and sheet. Skin prep: Chlorhexidine; local anesthetic administered A antimicrobial bonded/coated triple lumen catheter was placed in the right femoral vein due to multiple attempts, met obstruction with passing of wire and therefore aborted attempt; L IJ did not appear on ultrasound to be an option- patient with prior extensive thyroid surgery, no other available access using the Seldinger technique.  Evaluation Blood flow good Complications: No apparent complications Patient did tolerate procedure well. Chest X-ray ordered to verify placement.  CXR: not needed for femoral line. .  Procedure performed with ultrasound guidance for real time vessel cannulation.     Kennieth Rad, AGACNP-BC [facility] Pgr: [phone] or if no answer [phone] 11/30/2017, 3:15 AM

## 2017-12-01 ENCOUNTER — Encounter (HOSPITAL_COMMUNITY): Payer: Self-pay

## 2017-12-01 ENCOUNTER — Ambulatory Visit (HOSPITAL_COMMUNITY): Payer: Medicare Other

## 2017-12-01 ENCOUNTER — Inpatient Hospital Stay (HOSPITAL_COMMUNITY): Payer: Medicare Other

## 2017-12-01 LAB — CBC
HEMATOCRIT: 22.4 % — AB (ref 36.0–46.0)
HEMOGLOBIN: 7.8 g/dL — AB (ref 12.0–15.0)
MCH: 29 pg (ref 26.0–34.0)
MCHC: 34.8 g/dL (ref 30.0–36.0)
MCV: 83.3 fL (ref 78.0–100.0)
Platelets: 308 10*3/uL (ref 150–400)
RBC: 2.69 MIL/uL — AB (ref 3.87–5.11)
RDW: 15.7 % — ABNORMAL HIGH (ref 11.5–15.5)
WBC: 12.7 10*3/uL — ABNORMAL HIGH (ref 4.0–10.5)

## 2017-12-01 LAB — BASIC METABOLIC PANEL
Anion gap: 9 (ref 5–15)
BUN: 20 mg/dL (ref 6–20)
CO2: 16 mmol/L — ABNORMAL LOW (ref 22–32)
Calcium: 8.2 mg/dL — ABNORMAL LOW (ref 8.9–10.3)
Chloride: 108 mmol/L (ref 101–111)
Creatinine, Ser: 1.11 mg/dL — ABNORMAL HIGH (ref 0.44–1.00)
GFR calc Af Amer: 54 mL/min — ABNORMAL LOW (ref >60)
GFR calc non Af Amer: 47 mL/min — ABNORMAL LOW (ref >60)
Glucose, Bld: 142 mg/dL — ABNORMAL HIGH (ref 65–99)
Potassium: 3.3 mmol/L — ABNORMAL LOW (ref 3.5–5.1)
Sodium: 133 mmol/L — ABNORMAL LOW (ref 135–145)

## 2017-12-01 LAB — URINE CULTURE: Culture: >=100000 — AB

## 2017-12-01 LAB — GLUCOSE, CAPILLARY
GLUCOSE-CAPILLARY: 130 mg/dL — AB (ref 65–99)
Glucose-Capillary: 128 mg/dL — ABNORMAL HIGH (ref 65–99)
Glucose-Capillary: 117 mg/dL — ABNORMAL HIGH (ref 65–99)
Glucose-Capillary: 140 mg/dL — ABNORMAL HIGH (ref 65–99)
Glucose-Capillary: 149 mg/dL — ABNORMAL HIGH (ref 65–99)

## 2017-12-01 LAB — MAGNESIUM: MAGNESIUM: 1.9 mg/dL (ref 1.7–2.4)

## 2017-12-01 MED ORDER — POTASSIUM CHLORIDE 20 MEQ/15ML (10%) PO SOLN
40.0000 meq | Freq: Once | ORAL | Status: DC
  Filled 2017-12-01: qty 30

## 2017-12-01 MED ORDER — ALBUTEROL SULFATE (2.5 MG/3ML) 0.083% IN NEBU
2.5000 mg | INHALATION_SOLUTION | RESPIRATORY_TRACT | Status: DC | PRN
  Administered 2017-12-01 – 2017-12-03 (×2): 2.5 mg via RESPIRATORY_TRACT
  Filled 2017-12-01 (×2): qty 3

## 2017-12-01 MED ORDER — VITAL AF 1.2 CAL PO LIQD
1000.0000 mL | ORAL | Status: DC
  Filled 2017-12-01: qty 1000

## 2017-12-01 MED ORDER — FUROSEMIDE 10 MG/ML IJ SOLN
20.0000 mg | Freq: Once | INTRAMUSCULAR | Status: AC
  Administered 2017-12-01: 20 mg via INTRAVENOUS
  Filled 2017-12-01: qty 2

## 2017-12-01 MED ORDER — LORAZEPAM 2 MG/ML IJ SOLN
0.5000 mg | INTRAMUSCULAR | Status: DC | PRN
  Administered 2017-12-01: 0.5 mg via INTRAVENOUS
  Filled 2017-12-01: qty 1

## 2017-12-01 MED ORDER — PRO-STAT SUGAR FREE PO LIQD
30.0000 mL | Freq: Every day | ORAL | Status: DC
  Administered 2017-12-01: 30 mL
  Filled 2017-12-01: qty 30

## 2017-12-01 MED ORDER — POTASSIUM CHLORIDE 20 MEQ/15ML (10%) PO SOLN
20.0000 meq | ORAL | Status: AC
  Administered 2017-12-01 (×2): 20 meq
  Filled 2017-12-01 (×2): qty 15

## 2017-12-01 MED ORDER — POTASSIUM CHLORIDE 20 MEQ/15ML (10%) PO SOLN
20.0000 meq | Freq: Once | ORAL | Status: DC
  Filled 2017-12-01: qty 15

## 2017-12-01 MED ORDER — HYDROCORTISONE NA SUCCINATE PF 100 MG IJ SOLR
50.0000 mg | Freq: Four times a day (QID) | INTRAMUSCULAR | Status: DC
  Administered 2017-12-01 – 2017-12-02 (×3): 50 mg via INTRAVENOUS
  Filled 2017-12-01 (×3): qty 2

## 2017-12-01 NOTE — Procedures (Signed)
Extubation Procedure Note  Patient Details:   Name: Carol Powers DOB: [birth year] MRN: [id number]   Airway Documentation:  Airway 7.5 mm (Active)  Secured at (cm) 22 cm 12/01/2017  8:29 AM  Measured From Lips 12/01/2017  8:29 AM  Secured Location Right 12/01/2017  8:29 AM  Secured By [employer] 12/01/2017  8:29 AM  Tube Holder Repositioned Yes 12/01/2017  8:29 AM  Cuff Pressure (cm H2O) 26 cm H2O 12/01/2017  8:29 AM  Site Condition Dry 12/01/2017  8:29 AM    Evaluation  O2 sats: stable throughout Complications: No apparent complications Patient did tolerate procedure well. Bilateral Breath Sounds: Rhonchi   Yes   Patient extubated to 8L sal[place]; O2 sats currently acceptable. RT will continue to monitor patient.   Lamonte Sakai 12/01/2017, 12:57 PM

## 2017-12-01 NOTE — Progress Notes (Signed)
[facility] ADULT ICU REPLACEMENT PROTOCOL FOR AM LAB REPLACEMENT ONLY  The patient does apply for the [facility] Adult ICU Electrolyte Replacment Protocol based on the criteria listed below:   1. Is GFR >/= 40 ml/min? Yes.    Patient's GFR today is 47 2. Is urine output >/= 0.5 ml/kg/hr for the last 6 hours? Yes.   Patient's UOP is .62 ml/kg/hr 3. Is BUN < 60 mg/dL? Yes.    Patient's BUN today is 20 4. Abnormal electrolyte(s): K was 3.3 5. Ordered repletion with: per protocol 6. If a panic level lab has been reported, has the CCM MD in charge been notified? Yes.  .   Physician:  Dr. Jonetta Speak, Philis Nettle 12/01/2017 5:50 AM

## 2017-12-01 NOTE — Progress Notes (Addendum)
PULMONARY / CRITICAL CARE MEDICINE   Name: Carol Powers MRN: [id number] DOB: August 16, 1941    ADMISSION DATE:  12/01/2017 CONSULTATION DATE:  11/24/2017  REFERRING MD:  Dr. Tyrone Nine / EDP   CHIEF COMPLAINT:  Hypotension   HISTORY OF PRESENT ILLNESS:   77 y/o F, former remote smoker (5 years, quit 50 years ago) who presented to [facility] ER on 3/6 after developing altered mental status at home.    The patient & patients family provide information.  They report she had a cold in December of 2018.  She reportedly recovered from the cold but the cough never improved.  In addition, she had back and leg pain that she was managing with ibuprofen.    She was evaluated by her PCP with a CXR which was abnormal.  She was given tramadol for her leg / back pain.  It did not relieve the pain and the family called 3/5 and asked if it was ok to give two pain pills at a time.  They were told it was ok to increase the pain medication.  To further evaluate abnormal CXR a CT of the chest was completed on 3/4 that demonstrated a 6.1 cm posterior RUL mass concerning for primary bronchogenic neoplasm, mass extends to the right perihilar region and abuts the right major fissure.  Mediastinal & right hildar nodal metastases.  Bilateral adrenal masses measuring up to 5.2 cm on the R worrisome for metastatic disease.  Scattered hepatic lesions measuring up to 70mm, indeterminate but worrisome for metastases. She was planned to see Dr. Earlie Server on 3/6 for malignant work up.  In the interim, she had been taking increased dosing of tramadol for pain.  On the am of 3/6 the patient attempted to get up and get ready for her planned visit with Dr. Julien Nordmann and was too weak to stand.  She had to be lowered to the floor.  The patient was lethargic but able to follow commands.    Since August 2018 the patient has lost 16 lbs.  She has had decreased appetite and nausea.  She denies night sweats, fevers, vomiting, shortness of breath, pain with  inspiration.  She does report chills.    On presentation, she was found to have soft blood pressures (94/41).  She was given 2.5L of NS. Initial labs - Na 125, K 5.1, Cl 94, glucose 104, BUN 37, Sr Cr 2.2, lactic acid 2.25,  WBC 16.4, hgb 11 and platelets 461.  CXR demonstrates known right suprahilar mass with slight worsening density in the RUL, LLL.  BP improved significantly with fluid administration.    PCCM called for evaluation.    SUBJECTIVE:  Tmax 100.4 in last 24 hours.  Pt placed on PSV wean 5/8 at 1010.  Propofol decreased for WUA.  Pt denies pain.  Off vasopressors.  VITAL SIGNS: BP (!) 129/54   Pulse (!) 110   Temp 99.1 F (37.3 C)   Resp (!) 32   Ht 5\' 1"  (1.549 m)   Wt 142 lb (64.4 kg)   SpO2 92%   BMI 26.83 kg/m   HEMODYNAMICS: CVP:  [9 mmHg-10 mmHg] 10 mmHg  VENTILATOR SETTINGS: Vent Mode: PRVC FiO2 (%):  [40 %] 40 % Set Rate:  [20 bmp] 20 bmp Vt Set:  [400 mL] 400 mL PEEP:  [8 cmH20] 8 cmH20 Plateau Pressure:  [12 cmH20-18 cmH20] 15 cmH20  INTAKE / OUTPUT: I/O last 3 completed shifts: In: 12750.2 [I.V.:11704; NG/GT:196.3; IV Piggyback:850] Out: 6283 [Urine:4050;  Emesis/NG output:1000; Other:25]  PHYSICAL EXAMINATION: General: ill appearing elderly female in NAD lying in bed HEENT: MM pink/moist, ETT Neuro: awakens, alert, follows commands  CV: s1s2 rrr, no m/r/g PULM: even/non-labored, lungs bilaterally clear GI: soft, non-tender, bsx4 hypoactive  Extremities: warm/dry, 1-2+ generalized edema  Skin: no rashes or lesions  LABS:  BMET Recent Labs  Lab 12/23/2017 1133 12/15/2017 1238 11/30/17 0500 12/01/17 0500  NA 124* 125* 129* 133*  K 4.8 5.1 3.6 3.3*  CL 92* 94* 106 108  CO2 20*  --  14* 16*  BUN 41* 37* 27* 20  CREATININE 2.22* 2.20* 1.36* 1.11*  GLUCOSE 110* 104* 243* 142*    Electrolytes Recent Labs  Lab 12/08/2017 1133 11/30/17 0500 12/01/17 0500  CALCIUM 9.8 7.7* 8.2*  MG  --  1.4* 1.9  PHOS  --  2.7  --     CBC Recent  Labs  Lab 12/18/2017 1133 12/20/2017 1238 11/30/17 0500 12/01/17 0500  WBC 16.4*  --  21.9* 12.7*  HGB 11.0* 11.9* 8.5* 7.8*  HCT 32.7* 35.0* 25.7* 22.4*  PLT 461*  --  345 308    Coag's No results for input(s): APTT, INR in the last 168 hours.  Sepsis Markers Recent Labs  Lab 12/20/2017 1429 12/14/2017 2007 11/28/2017 2144  LATICACIDVEN 2.87* 2.1* 3.3*    ABG Recent Labs  Lab 11/25/2017 2215 11/30/17 0240 11/30/17 0616  PHART 7.153* 7.255* 7.284*  PCO2ART 44.7 28.1* 26.2*  PO2ART 74.4* 129* 124*    Liver Enzymes Recent Labs  Lab 12/05/2017 1133  AST 38  ALT 17  ALKPHOS 98  BILITOT 1.1  ALBUMIN 3.1*    Cardiac Enzymes No results for input(s): TROPONINI, PROBNP in the last 168 hours.  Glucose Recent Labs  Lab 12/16/2017 1327 11/30/17 1248 11/30/17 1603 11/30/17 1938 11/30/17 2344 12/01/17 0321  GLUCAP 93 147* 137* 110* 130* 128*    Imaging Dg Chest Port 1 View  Result Date: 12/01/2017 CLINICAL DATA:  Acute respiratory failure with hypoxia EXAM: PORTABLE CHEST 1 VIEW COMPARISON:  12/01/2007 FINDINGS: Endotracheal tube in good position. Gastric tube enters the stomach with the tip not visualized Right upper lobe mass lesion and paratracheal adenopathy unchanged. Progressive bibasilar airspace disease. Progression of bilateral pleural effusions. Progression of right upper lobe airspace disease. IMPRESSION: Progression of bilateral airspace disease and small pleural effusions. Possible edema or pneumonia Right upper lobe mass lesion Electronically Signed   By: Franchot Gallo M.D.   On: 12/01/2017 07:02    STUDIES:  3/4  CT Chest >> 6.1 cm posterior RUL mass concerning for primary bronchogenic neoplasm, mass extends to the right perihilar region and abuts the right major fissure.  Mediastinal & right hildar nodal metastases.  Bilateral adrenal masses measuring up to 5.2 cm on the R worrisome for metastatic disease.  Scattered hepatic lesions measuring up to 45mm,  indeterminate but worrisome for metastases.   Renal US 3/6 >> normal renal US CT Head 3/6 >> mild chronic ischemic white matter disease. No acute abnormality.   CULTURES: BCx2 3/6 >>  UA 3/6 >> concern for UTI  UC 3/6 >> greater 100k E-Coli >> pan sensitive   ANTIBIOTICS: Vanco 3/6 x1 Zosyn 3/6 x1 Rocephin (UTI) 3/7 >>  Azithro (L infiltrate) 3/7 >>   SIGNIFICANT EVENTS: 3/06  Admit, initially improved with IVF > decompensated late PM / intubated  3/07  Levophed 22 mcg, Vasopressin. CVP 10  LINES/TUBES: ETT 3/6 >>  L IJ TLC 3/7 >>  DISCUSSION: 76  y/o F, former remote smoker (50 years ago), admitted with hypotension and altered mental status.  Concern for possible UTI. Hypovolemia, AKI.  Recent discovery of lung mass.    ASSESSMENT / PLAN:  PULMONARY A: Right Lung Mass - new dx as of 3/4 Possible Post-Obstructive PNA  Former Remote Smoker  P:   PSV wean with hope for extubation  PRVC 8cc/kg as rest mode  Follow CXR  WUA daily  Lasix 20 mg IV x1 now   CARDIOVASCULAR A:  Septic Shock - in setting of E-Coli UTI, LLL infiltrate, additionally hypovolemia (HCTZ), narcotics, poor PO intake contributing  Hx HTN  P:  Discontinue [facility]opressors from [facility] Continue solu-cortef for now > consider d/c in am  KVO IVF  Hold home norvasc, benazepril, triamterene-HCTZ, ASA Trend CVP  ICU monitoring  RENAL A:   AKI - suspect sepsis, hypovolemia, NSAIDS, hypotension, ACE-I Hyponatremia  Metabolic Acidosis  P:   KVO IVF Trend BMP / urinary output Replace electrolytes as indicated Avoid nephrotoxic agents, ensure adequate renal perfusion  GASTROINTESTINAL A:   Nausea  P:   NPO / OGT TF on hold for possible extubation  PPI   HEMATOLOGIC  A:   Anemia  P:  Trend CBC  Heparin for DVT prophylaxis   ONCOLOGIC A: 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, Adrenal Metastases  P: Consider outpatient PET vs biopsy while inpatient   INFECTIOUS A:   E-Coli (Pan-Sens) UTI   Severe Sepsis  Post-Obstructive PNA  P:   Cultures as above  Continue rocephin for UTI  Doxy added for possible PNA   ENDOCRINE A:   Hypothyroidism   Hyperglycemia  P:   SSI  Continue synthroid   NEUROLOGIC A:   Pain - suspect secondary to metastatic disease   Depression  P:   RASS Goal:  0 WUA  Propofol for sedation  Hold home zoloft  FAMILY  - Updates: Husband, daughter, grand-daughter and nephew updated at bedside     - Inter-disciplinary family meet or Palliative Care meeting due by:  3/12  CC Time 30 minutes   Noe Gens, NP-C [facility] Pgr: [phone] or if no answer [phone] 12/01/2017, 9:50 AM

## 2017-12-01 NOTE — Progress Notes (Signed)
Nutrition Follow-up  DOCUMENTATION CODES:   Not applicable  INTERVENTION:  - Will adjust TF regimen: Vital AF 1.2 @ 45 mL/hr with 30 mL Prostat once/day. This regimen + kcal from current Propofol rate will provide 1568 kcal (105% estimated kcal need), 96 grams of protein, and 876 mL free water.  - Will monitor for changes in Propofol rate.  - Free water flush to be per MD/NP, if desired.  NUTRITION DIAGNOSIS:   Inadequate oral intake related to inability to eat as evidenced by NPO status. -ongoing  GOAL:   Patient will meet greater than or equal to 90% of their needs -met with TF  MONITOR:   TF tolerance, Vent status, Weight trends, Labs  ASSESSMENT:   77-year-old former smoker with new diagnosis of right upper lobe mass concerning for cancer. Admitted with altered mental status, AKI, hypotension, likely sepsis from UTI, elevated lactic acid.  No new weight since 3/6. Pt remains intubated, sedated, and with OGT in place. She is currently receiving TF at goal rate: Vital AF 1.2 @ 55 mL/hr which is providing 1584 kcal, 99 grams of protein, and 1070 mL free water.  Patient is currently intubated on ventilator support MV: 10.7 L/min Temp (24hrs), Avg:99.7 F (37.6 C), Min:99.1 F (37.3 C), Max:100.4 F (38 C) Propofol: 6.5 ml/hr (172 kcal) BP: 135/55 and MAP: 81  Medications reviewed; 100 mg Solu-Cortef QID, sliding scale Novolog, 100 mcg Synthroid per OGT/day, 20 mEq KCl per OGT x2 doses today.   Labs reviewed; CBG: 128 mg/dL this AM, Na: 133 mmol/L, K: 3.3 mmol/L, creatinine: 1.11 mg/dL, Ca: 8.2 mg/dL, GFR: 37 mL/min.   IVF: LR @ 125 mL/hr.  Drip: Propofol @ 15 mcg/min/kg.     Diet Order:  Diet NPO time specified  EDUCATION NEEDS:   No education needs have been identified at this time  Skin:  Skin Assessment: Reviewed RN Assessment  Last BM:  PTA/unknown  Height:   Ht Readings from Last 1 Encounters:  12/11/2017 5' 1" (1.549 m)    Weight:   Wt Readings  from Last 1 Encounters:  12/18/2017 142 lb (64.4 kg)    Ideal Body Weight:  47.73 kg  BMI:  Body mass index is 26.83 kg/m.  Estimated Nutritional Needs:   Kcal:  1499  Protein:  90-103 grams (1.4-1.6 grams/kg)  Fluid:  >/= 2 L/day       Jessica Ostheim, MS, RD, LDN, CNSC Inpatient Clinical Dietitian Pager # [phone] After hours/weekend pager # [phone]  

## 2017-12-02 ENCOUNTER — Inpatient Hospital Stay (HOSPITAL_COMMUNITY): Payer: Medicare Other

## 2017-12-02 LAB — CBC
HCT: 27.4 % — ABNORMAL LOW (ref 36.0–46.0)
Hemoglobin: 9.3 g/dL — ABNORMAL LOW (ref 12.0–15.0)
MCH: 27.6 pg (ref 26.0–34.0)
MCHC: 33.9 g/dL (ref 30.0–36.0)
MCV: 81.3 fL (ref 78.0–100.0)
PLATELETS: 346 10*3/uL (ref 150–400)
RBC: 3.37 MIL/uL — ABNORMAL LOW (ref 3.87–5.11)
RDW: 15.6 % — AB (ref 11.5–15.5)
WBC: 16.9 10*3/uL — ABNORMAL HIGH (ref 4.0–10.5)

## 2017-12-02 LAB — BASIC METABOLIC PANEL
Anion gap: 10 (ref 5–15)
BUN: 24 mg/dL — AB (ref 6–20)
CO2: 22 mmol/L (ref 22–32)
CREATININE: 0.88 mg/dL (ref 0.44–1.00)
Calcium: 8.7 mg/dL — ABNORMAL LOW (ref 8.9–10.3)
Chloride: 107 mmol/L (ref 101–111)
GFR calc non Af Amer: >60 mL/min (ref >60)
GLUCOSE: 107 mg/dL — AB (ref 65–99)
Potassium: 2.5 mmol/L — CL (ref 3.5–5.1)
Sodium: 139 mmol/L (ref 135–145)

## 2017-12-02 LAB — GLUCOSE, CAPILLARY
GLUCOSE-CAPILLARY: 106 mg/dL — AB (ref 65–99)
GLUCOSE-CAPILLARY: 108 mg/dL — AB (ref 65–99)
GLUCOSE-CAPILLARY: 89 mg/dL (ref 65–99)
Glucose-Capillary: 107 mg/dL — ABNORMAL HIGH (ref 65–99)
Glucose-Capillary: 116 mg/dL — ABNORMAL HIGH (ref 65–99)
Glucose-Capillary: 133 mg/dL — ABNORMAL HIGH (ref 65–99)
Glucose-Capillary: 66 mg/dL (ref 65–99)
Glucose-Capillary: 96 mg/dL (ref 65–99)

## 2017-12-02 LAB — MAGNESIUM: Magnesium: 1.8 mg/dL (ref 1.7–2.4)

## 2017-12-02 LAB — LEGIONELLA PNEUMOPHILA SEROGP 1 UR AG: L. pneumophila Serogp 1 Ur Ag: NEGATIVE

## 2017-12-02 LAB — PHOSPHORUS: Phosphorus: 2.3 mg/dL — ABNORMAL LOW (ref 2.5–4.6)

## 2017-12-02 MED ORDER — ACETAMINOPHEN 160 MG/5ML PO SOLN
650.0000 mg | Freq: Four times a day (QID) | ORAL | Status: DC | PRN
  Administered 2017-12-02 – 2017-12-07 (×8): 650 mg
  Filled 2017-12-02 (×8): qty 20.3

## 2017-12-02 MED ORDER — ALPRAZOLAM 0.25 MG PO TABS: 0.2500 mg | ORAL_TABLET | Freq: Three times a day (TID) | ORAL | Status: DC | PRN

## 2017-12-02 MED ORDER — FUROSEMIDE 10 MG/ML IJ SOLN
20.0000 mg | Freq: Once | INTRAMUSCULAR | Status: AC
  Administered 2017-12-02: 20 mg via INTRAVENOUS
  Filled 2017-12-02: qty 2

## 2017-12-02 MED ORDER — POTASSIUM CHLORIDE 20 MEQ/15ML (10%) PO SOLN
40.0000 meq | Freq: Three times a day (TID) | ORAL | Status: AC
  Administered 2017-12-02 (×3): 40 meq via ORAL
  Filled 2017-12-02 (×2): qty 30

## 2017-12-02 MED ORDER — AMLODIPINE BESYLATE 5 MG PO TABS
5.0000 mg | ORAL_TABLET | Freq: Every day | ORAL | Status: DC
  Administered 2017-12-02 – 2017-12-03 (×2): 5 mg via ORAL
  Filled 2017-12-02 (×2): qty 1

## 2017-12-02 MED ORDER — ORAL CARE MOUTH RINSE
15.0000 mL | Freq: Two times a day (BID) | OROMUCOSAL | Status: DC
  Administered 2017-12-02 – 2017-12-09 (×13): 15 mL via OROMUCOSAL

## 2017-12-02 MED ORDER — DOCUSATE SODIUM 50 MG/5ML PO LIQD
100.0000 mg | Freq: Two times a day (BID) | ORAL | Status: DC | PRN
  Administered 2017-12-03: 100 mg via ORAL
  Filled 2017-12-02: qty 10

## 2017-12-02 NOTE — Progress Notes (Addendum)
PULMONARY / CRITICAL CARE MEDICINE   Name: Carol Powers MRN: [id number] DOB: 09-Jan-1941    ADMISSION DATE:  12/19/2017 CONSULTATION DATE:  12/15/2017  REFERRING MD:  Dr. Tyrone Nine / EDP   CHIEF COMPLAINT:  Hypotension   HISTORY OF PRESENT ILLNESS:   77 y/o F, former remote smoker (5 years, quit 50 years ago) who presented to [facility] ER on 3/6 after developing altered mental status at home.    The patient & patients family provide information.  They report she had a cold in December of 2018.  She reportedly recovered from the cold but the cough never improved.  In addition, she had back and leg pain that she was managing with ibuprofen.    She was evaluated by her PCP with a CXR which was abnormal.  She was given tramadol for her leg / back pain.  It did not relieve the pain and the family called 3/5 and asked if it was ok to give two pain pills at a time.  They were told it was ok to increase the pain medication.  To further evaluate abnormal CXR a CT of the chest was completed on 3/4 that demonstrated a 6.1 cm posterior RUL mass concerning for primary bronchogenic neoplasm, mass extends to the right perihilar region and abuts the right major fissure.  Mediastinal & right hildar nodal metastases.  Bilateral adrenal masses measuring up to 5.2 cm on the R worrisome for metastatic disease.  Scattered hepatic lesions measuring up to 78mm, indeterminate but worrisome for metastases. She was planned to see Dr. Earlie Server on 3/6 for malignant work up.  In the interim, she had been taking increased dosing of tramadol for pain.  On the am of 3/6 the patient attempted to get up and get ready for her planned visit with Dr. Julien Nordmann and was too weak to stand.  She had to be lowered to the floor.  The patient was lethargic but able to follow commands.    Since August 2018 the patient has lost 16 lbs.  She has had decreased appetite and nausea.  She denies night sweats, fevers, vomiting, shortness of breath, pain with  inspiration.  She does report chills.    On presentation, she was found to have soft blood pressures (94/41).  She was given 2.5L of NS. Initial labs - Na 125, K 5.1, Cl 94, glucose 104, BUN 37, Sr Cr 2.2, lactic acid 2.25,  WBC 16.4, hgb 11 and platelets 461.  CXR demonstrates known right suprahilar mass with slight worsening density in the RUL, LLL.  BP improved significantly with fluid administration.    PCCM called for evaluation.    SUBJECTIVE:   Extubated.  Stable overnight.  No new events.  VITAL SIGNS: BP (!) 146/74 (BP Location: Right Arm)   Pulse (!) 105   Temp 99.7 F (37.6 C)   Resp (!) 25   Ht 5\' 1"  (1.549 m)   Wt 158 lb 8.2 oz (71.9 kg)   SpO2 97%   BMI 29.95 kg/m   HEMODYNAMICS: CVP:  [8 mmHg-11 mmHg] 11 mmHg  VENTILATOR SETTINGS: Vent Mode: CPAP;PSV FiO2 (%):  [40 %] 40 % Set Rate:  [20 bmp] 20 bmp Vt Set:  [400 mL] 400 mL PEEP:  [5 cmH20-8 cmH20] 5 cmH20 Pressure Support:  [5 cmH20] 5 cmH20 Plateau Pressure:  [6 cmH20] 6 cmH20  INTAKE / OUTPUT: I/O last 3 completed shifts: In: 4198.3 [P.O.:240; I.V.:2578.3; NG/GT:630; IV Piggyback:750] Out: 4700 [Urine:4700]  PHYSICAL EXAMINATION: Gen:  No acute distress, elderly frail. HEENT:  EOMI, sclera anicteric Neck:     No masses; no thyromegaly Lungs:    Clear to auscultation bilaterally; normal respiratory effort CV:         Regular rate and rhythm; no murmurs Abd:      + bowel sounds; soft, non-tender; no palpable masses, no distension Ext:    No edema; adequate peripheral perfusion Skin:      Warm and dry; no rash Neuro: alert and oriented x 3 Psych: normal mood and affect  LABS:  BMET Recent Labs  Lab 11/30/17 0500 12/01/17 0500 12/02/17 0531  NA 129* 133* 139  K 3.6 3.3* 2.5*  CL 106 108 107  CO2 14* 16* 22  BUN 27* 20 24*  CREATININE 1.36* 1.11* 0.88  GLUCOSE 243* 142* 107*    Electrolytes Recent Labs  Lab 11/30/17 0500 12/01/17 0500 12/02/17 0531  CALCIUM 7.7* 8.2* 8.7*  MG  1.4* 1.9 1.8  PHOS 2.7  --  2.3*    CBC Recent Labs  Lab 11/30/17 0500 12/01/17 0500 12/02/17 0531  WBC 21.9* 12.7* 16.9*  HGB 8.5* 7.8* 9.3*  HCT 25.7* 22.4* 27.4*  PLT 345 308 346    Coag's No results for input(s): APTT, INR in the last 168 hours.  Sepsis Markers Recent Labs  Lab 12/06/2017 1429 11/24/2017 2007 11/30/2017 2144  LATICACIDVEN 2.87* 2.1* 3.3*    ABG Recent Labs  Lab 12/07/2017 2215 11/30/17 0240 11/30/17 0616  PHART 7.153* 7.255* 7.284*  PCO2ART 44.7 28.1* 26.2*  PO2ART 74.4* 129* 124*    Liver Enzymes Recent Labs  Lab 12/03/2017 1133  AST 38  ALT 17  ALKPHOS 98  BILITOT 1.1  ALBUMIN 3.1*    Cardiac Enzymes No results for input(s): TROPONINI, PROBNP in the last 168 hours.  Glucose Recent Labs  Lab 12/01/17 1143 12/01/17 1554 12/01/17 2048 12/01/17 2327 12/02/17 0339 12/02/17 0756  GLUCAP 140* 149* 116* 107* 96 106*    Imaging Dg Chest Port 1 View  Result Date: 12/02/2017 CLINICAL DATA:  Acute respiratory failure. EXAM: PORTABLE CHEST 1 VIEW COMPARISON:  12/01/2017 FINDINGS: Endotracheal and enteric tubes have been removed. Abnormal right paratracheal soft tissue density corresponds to known upper lobe mass and lymphadenopathy. There are patchy left greater than right airspace opacities in the lung bases which have significantly improved from prior. Small bilateral pleural effusions also may have mildly decreased in size. Right upper lobe aeration also appear slightly improved. No pneumothorax is identified. IMPRESSION: 1. Interval extubation. Improved lung aeration with residual left greater than right basilar atelectasis or infiltrates. 2. Small bilateral pleural effusions. 3. Known right upper lobe mass and lymphadenopathy. Electronically Signed   By: Logan Bores M.D.   On: 12/02/2017 07:01   STUDIES:  3/4  CT Chest >> 6.1 cm posterior RUL mass concerning for primary bronchogenic neoplasm, mass extends to the right perihilar region and  abuts the right major fissure.  Mediastinal & right hildar nodal metastases.  Bilateral adrenal masses measuring up to 5.2 cm on the R worrisome for metastatic disease.  Scattered hepatic lesions measuring up to 79mm, indeterminate but worrisome for metastases.   Renal US 3/6 >> normal renal US CT Head 3/6 >> mild chronic ischemic white matter disease. No acute abnormality.   CULTURES: BCx2 3/6 >>  UA 3/6 >> concern for UTI  UC 3/6 >> greater 100k E-Coli >> pan sensitive   ANTIBIOTICS: Vanco 3/6 x1 Zosyn 3/6 x1 Rocephin (UTI) 3/7 >>  Azithro (L infiltrate) 3/7 >>   SIGNIFICANT EVENTS: 3/06  Admit, initially improved with IVF > decompensated late PM / intubated  3/07  Levophed 22 mcg, Vasopressin. CVP 10 3/8 Off pressors, extubated.  LINES/TUBES: ETT 3/6 >> 3/8 Rt femoral TLC 3/7 >> A line 3/7 >>  DISCUSSION: 77 y/o F, former remote smoker (50 years ago), admitted with hypotension and altered mental status.  Concern for possible UTI. Hypovolemia, AKI.  Recent discovery of lung mass.    ASSESSMENT / PLAN:  PULMONARY A: Right Lung Mass - new dx as of 3/4 Possible Post-Obstructive PNA  Former Remote Smoker  P:   Stable post extubation.  Wean down oxygen Will need work up of lung mass Family is anxious to get the diagnosis during this admission.  We will reassess her status next week.  Consider outpatient PET vs biopsy while inpatient. Can IR to consult on Monday to see if the liver, adrenal lesions can be approached first.   CARDIOVASCULAR A:  Septic Shock - in setting of E-Coli UTI, LLL infiltrate, additionally hypovolemia (HCTZ), narcotics, poor PO intake contributing  Hx HTN  P:  Off pressors.  Stop stress dose steroids Hold benazepril, triamterene-HCTZ, ASA Resume norvasc at lower dose.  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 No family at bedside 3/9    - Inter-disciplinary family meet or Palliative Care meeting due by:  3/12  Marshell Garfinkel MD [facility] Pager [phone] If no answer or after 3pm call: [phone] 12/02/2017, 8:28 AM

## 2017-12-02 NOTE — Progress Notes (Signed)
[facility] Progress Note Patient Name: Carol Powers DOB: August 28, 1941 MRN: [id number]   Date of Service  12/02/2017  HPI/Events of Note  Fever to 100.6 F - Already on Ceftriaxone and Doxycycline. AST and ALT both normal.   eICU Interventions  Will order: 1. Blood cultures X 2 now.  2. Tylenol liquid 560 mg per tube Q 6 hours PRN Temp > 100.5. F.     Intervention Category Major Interventions: Infection - evaluation and management  Carol Powers [place] 12/02/2017, 11:05 PM

## 2017-12-03 ENCOUNTER — Inpatient Hospital Stay (HOSPITAL_COMMUNITY): Payer: Medicare Other

## 2017-12-03 DIAGNOSIS — D72829 Elevated white blood cell count, unspecified: Secondary | ICD-10-CM

## 2017-12-03 DIAGNOSIS — J189 Pneumonia, unspecified organism: Secondary | ICD-10-CM

## 2017-12-03 DIAGNOSIS — E278 Other specified disorders of adrenal gland: Secondary | ICD-10-CM

## 2017-12-03 DIAGNOSIS — C3411 Malignant neoplasm of upper lobe, right bronchus or lung: Secondary | ICD-10-CM

## 2017-12-03 DIAGNOSIS — A419 Sepsis, unspecified organism: Secondary | ICD-10-CM

## 2017-12-03 LAB — CBC
HEMATOCRIT: 27.4 % — AB (ref 36.0–46.0)
Hemoglobin: 9.1 g/dL — ABNORMAL LOW (ref 12.0–15.0)
MCH: 27.2 pg (ref 26.0–34.0)
MCHC: 33.2 g/dL (ref 30.0–36.0)
MCV: 82 fL (ref 78.0–100.0)
Platelets: 177 10*3/uL (ref 150–400)
RBC: 3.34 MIL/uL — AB (ref 3.87–5.11)
RDW: 16.2 % — ABNORMAL HIGH (ref 11.5–15.5)
WBC: 16.6 10*3/uL — AB (ref 4.0–10.5)

## 2017-12-03 LAB — GLUCOSE, CAPILLARY
GLUCOSE-CAPILLARY: 80 mg/dL (ref 65–99)
GLUCOSE-CAPILLARY: 65 mg/dL (ref 65–99)
Glucose-Capillary: 96 mg/dL (ref 65–99)
Glucose-Capillary: 75 mg/dL (ref 65–99)
Glucose-Capillary: 93 mg/dL (ref 65–99)
Glucose-Capillary: 82 mg/dL (ref 65–99)

## 2017-12-03 LAB — BASIC METABOLIC PANEL
ANION GAP: 11 (ref 5–15)
BUN: 25 mg/dL — ABNORMAL HIGH (ref 6–20)
CALCIUM: 9 mg/dL (ref 8.9–10.3)
CO2: 21 mmol/L — ABNORMAL LOW (ref 22–32)
Chloride: 111 mmol/L (ref 101–111)
Creatinine, Ser: 0.83 mg/dL (ref 0.44–1.00)
GFR calc non Af Amer: >60 mL/min (ref >60)
Glucose, Bld: 83 mg/dL (ref 65–99)
Potassium: 3 mmol/L — ABNORMAL LOW (ref 3.5–5.1)
Sodium: 143 mmol/L (ref 135–145)

## 2017-12-03 LAB — MAGNESIUM: Magnesium: 1.7 mg/dL (ref 1.7–2.4)

## 2017-12-03 LAB — PHOSPHORUS: PHOSPHORUS: 1.2 mg/dL — AB (ref 2.5–4.6)

## 2017-12-03 MED ORDER — DEXTROSE 50 % IV SOLN
50.0000 mL | Freq: Once | INTRAVENOUS | Status: AC
  Administered 2017-12-03: 50 mL via INTRAVENOUS

## 2017-12-03 MED ORDER — FUROSEMIDE 10 MG/ML IJ SOLN
INTRAMUSCULAR | Status: AC
  Filled 2017-12-03: qty 2

## 2017-12-03 MED ORDER — PIPERACILLIN-TAZOBACTAM 3.375 G IVPB
3.3750 g | Freq: Three times a day (TID) | INTRAVENOUS | Status: AC
  Administered 2017-12-03 – 2017-12-07 (×12): 3.375 g via INTRAVENOUS
  Filled 2017-12-03 (×12): qty 50

## 2017-12-03 MED ORDER — LORAZEPAM 0.5 MG PO TABS: 0.5000 mg | ORAL_TABLET | Freq: Every day | ORAL | Status: DC

## 2017-12-03 MED ORDER — DEXTROSE 50 % IV SOLN
INTRAVENOUS | Status: AC
  Administered 2017-12-03: 50 mL
  Filled 2017-12-03: qty 50

## 2017-12-03 MED ORDER — POLYETHYLENE GLYCOL 3350 17 G PO PACK
17.0000 g | PACK | Freq: Every day | ORAL | Status: DC
  Administered 2017-12-03 – 2017-12-05 (×2): 17 g via ORAL
  Filled 2017-12-03 (×3): qty 1

## 2017-12-03 MED ORDER — DIPHENHYDRAMINE HCL 12.5 MG/5ML PO ELIX
12.5000 mg | ORAL_SOLUTION | Freq: Once | ORAL | Status: AC
  Administered 2017-12-03: 12.5 mg via ORAL
  Filled 2017-12-03: qty 5

## 2017-12-03 MED ORDER — TRAZODONE HCL 50 MG PO TABS: 50.0000 mg | ORAL_TABLET | Freq: Every evening | ORAL | Status: DC | PRN

## 2017-12-03 MED ORDER — DEXTROSE 50 % IV SOLN
INTRAVENOUS | Status: AC
  Administered 2017-12-03: 09:00:00
  Filled 2017-12-03: qty 50

## 2017-12-03 MED ORDER — POTASSIUM CHLORIDE CRYS ER 20 MEQ PO TBCR: 20.0000 meq | EXTENDED_RELEASE_TABLET | Freq: Once | ORAL | Status: DC

## 2017-12-03 MED ORDER — FUROSEMIDE 10 MG/ML IJ SOLN
20.0000 mg | Freq: Once | INTRAMUSCULAR | Status: AC
  Administered 2017-12-03: 20 mg via INTRAVENOUS

## 2017-12-03 MED ORDER — SERTRALINE HCL 50 MG PO TABS
25.0000 mg | ORAL_TABLET | Freq: Every day | ORAL | Status: DC
  Administered 2017-12-03: 25 mg via ORAL
  Filled 2017-12-03: qty 1

## 2017-12-03 MED ORDER — DEXTROSE 5 % IV SOLN
10.0000 meq | Freq: Once | INTRAVENOUS | Status: AC
  Administered 2017-12-03: 10 meq via INTRAVENOUS
  Filled 2017-12-03: qty 2.27

## 2017-12-03 MED ORDER — POTASSIUM CHLORIDE 20 MEQ/15ML (10%) PO SOLN
40.0000 meq | Freq: Once | ORAL | Status: AC
  Administered 2017-12-03: 40 meq via ORAL
  Filled 2017-12-03: qty 30

## 2017-12-03 MED ORDER — POTASSIUM CHLORIDE CRYS ER 20 MEQ PO TBCR: 40.0000 meq | EXTENDED_RELEASE_TABLET | Freq: Once | ORAL | Status: DC

## 2017-12-03 NOTE — Progress Notes (Addendum)
PROGRESS NOTE    Carol Powers  HKV:[id number] DOB: [birth year] DOA: 12/11/2017 PCP: Lavone Orn, MD   Brief Narrative: Patient is a 77 year old female with past medical history of hypothyroidism, former smoker who presented to the emergency department on 12/13/2017 with altered mental status.  Patient was also evaluated by her PCP as an outpatient with a chest x-ray which was abnormal.  Patient also underwent CT of the chest on 11/27/17 which demonstrated a 6.  1 cm posterior right upper lobe mass concerning for primary bronchogenic neoplasm.  Mass was found to be extending to the right perihilar region.  Also found to have mediastinal and right hilar nodal metastasis.  It also showed bilateral adrenal masses measuring up to 5.62 cm on the right side worrisome for metastatic disease.  Several scattered hepatic lesions measuring up to 11 cm were also noted on the CT scan concerning for metastatic lesions.  Patient was planned to see Dr. Julien Nordmann for management and workup.  Patient developed severe generalized weakness, altered mental status and had to be brought to the emergency department.. Chest x-ray done in the emergency department showed right suprahilar mass with slight worsening density in the right upper lobe, left lower lobe.  Patient was also hypotensive.  Sepsis was suspected secondary to postoperative pneumonia and patient was admitted under ICU service.  Patient had to be intubated.  Started on pressors and IV fluids.  Extubated now.  Patient was transferred to hospital service on 12/03/17.  Assessment & Plan:   Principal Problem:   Postobstructive pneumonia Active Problems:   Hypotension   Sepsis ([place])   Leukocytosis   Mass of right lung   Right adrenal mass (H[facility])  Septic shock :Likely secondary left lower lobe infiltrate secondary to postobstructive pneumonia.  Urine culture also grew E. Coli. Patient is off pressors.  Her blood pressure is currently stable. . Patient is a still  mildly febrile   Despitebeing on antibiotics.  We will change antibiotics to Zosyn today.  Patient also has  leukocytosis.  Blood cultures are negative so far.  Postoperative pneumonia: Continue antibiotics as above.  Pulmonary following.  Urinary tract infection: Urine culture grew E. Coli.  Continue antibiotic.  Right lung mass: New diagnosis as of 11/27/17.  Remote former smoker.  Needed intubation on presentation. CT chest findings:6.1 cm posterior right upper lobe mass, compatible with primary bronchogenic neoplasm, as above. Mass extends to the right perihilar region and abuts the right major fissure. Mediastinal and right hilar nodal metastases. Bilateral adrenal masses, measuring up to 5.2 cm on the right, worrisome for metastatic disease. Scattered hepatic lesions measuring up to 11 mm, indeterminate but worrisome for metastases.  We will consult IR tomorrow to see if liver, adrenal lesions can be biopsied.  Acute kidney injury: Resolved ,we will continue to monitor kidney function.  Hypothyroidism: Continue Synthyroid.  Depression: Continue Zoloft.  Hypokalemia/hypophosphatemia: Being supplemented.  Will continue to monitor the levels..    DVT prophylaxis: hep SQ Code Status: Full Family Communication: Daughter present at the bedside Disposition Plan: Undetermined at this point   Consultants: PCCM  Procedures: Intubation on  12/23/2017  Antimicrobials: Zosyn since 12/03/17 Ceftriaxone and doxycycline: 11/30/17 to 12/03/17  Subjective: Patient seen and examined at bedside this morning.  She was found to be in sinus tachycardia.  Blood pressure stable.  She is not in any obvious distress at present.  Could not sleep last night. Daughter was on the bedside.  Denies any shortness of breath.  She  still has  low-grade fever.  Objective: Vitals:   12/03/17 0400 12/03/17 0500 12/03/17 0800 12/03/17 1000  BP: 139/66  (!) 163/69 (!) 156/58  Pulse: 99     Resp: (!) 27  (!) 31  (!) 31  Temp: 99.9 F (37.7 C)  100.2 F (37.9 C) (!) 100.8 F (38.2 C)  TempSrc: Bladder     SpO2: 95%  95% 96%  Weight:  71.3 kg (157 lb 3 oz)    Height:        Intake/Output Summary (Last 24 hours) at 12/03/2017 1202 Last data filed at 12/03/2017 0900 Gross per 24 hour  Intake 722 ml  Output 2775 ml  Net -2053 ml   Filed Weights   11/28/2017 2154 12/02/17 0404 12/03/17 0500  Weight: 64.4 kg (142 lb) 71.9 kg (158 lb 8.2 oz) 71.3 kg (157 lb 3 oz)    Examination:  General exam: Not in obvious distress, chronically ill elderly female, fragile HEENT:PERRL,Oral mucosa moist, Ear/Nose normal on gross exam Respiratory system: Decreased air entry bilaterally in the bases. Cardiovascular system: Sinus tachycardia, S1 & S2 heard, RRR. No JVD, murmurs, rubs, gallops or clicks. No pedal edema. Gastrointestinal system: Abdomen is nondistended, soft and nontender. No organomegaly or masses felt. Normal bowel sounds heard. Central nervous system: Alert and oriented. No focal neurological deficits. Extremities: No edema, no clubbing ,no cyanosis, distal peripheral pulses palpable. Skin: No rashes, lesions or ulcers,no icterus ,no pallor    Data Reviewed: I have personally reviewed following labs and imaging studies  CBC: Recent Labs  Lab 12/15/2017 1133 12/02/2017 1238 11/30/17 0500 12/01/17 0500 12/02/17 0531 12/03/17 0829  WBC 16.4*  --  21.9* 12.7* 16.9* 16.6*  NEUTROABS 14.4*  --   --   --   --   --   HGB 11.0* 11.9* 8.5* 7.8* 9.3* 9.1*  HCT 32.7* 35.0* 25.7* 22.4* 27.4* 27.4*  MCV 83.4  --  85.1 83.3 81.3 82.0  PLT 461*  --  345 308 346 106   Basic Metabolic Panel: Recent Labs  Lab 12/06/2017 1133 11/27/2017 1238 11/30/17 0500 12/01/17 0500 12/02/17 0531 12/03/17 0434  NA 124* 125* 129* 133* 139 143  K 4.8 5.1 3.6 3.3* 2.5* 3.0*  CL 92* 94* 106 108 107 111  CO2 20*  --  14* 16* 22 21*  GLUCOSE 110* 104* 243* 142* 107* 83  BUN 41* 37* 27* 20 24* 25*  CREATININE 2.22*  2.20* 1.36* 1.11* 0.88 0.83  CALCIUM 9.8  --  7.7* 8.2* 8.7* 9.0  MG  --   --  1.4* 1.9 1.8 1.7  PHOS  --   --  2.7  --  2.3* 1.2*   GFR: Estimated Creatinine Clearance: 52.1 mL/min (by C-G formula based on SCr of 0.83 mg/dL). Liver Function Tests: Recent Labs  Lab 12/07/2017 1133  AST 38  ALT 17  ALKPHOS 98  BILITOT 1.1  PROT 6.8  ALBUMIN 3.1*   No results for input(s): LIPASE, AMYLASE in the last 168 hours. No results for input(s): AMMONIA in the last 168 hours. Coagulation Profile: No results for input(s): INR, PROTIME in the last 168 hours. Cardiac Enzymes: No results for input(s): CKTOTAL, CKMB, CKMBINDEX, TROPONINI in the last 168 hours. BNP (last 3 results) No results for input(s): PROBNP in the last 8760 hours. HbA1C: No results for input(s): HGBA1C in the last 72 hours. CBG: Recent Labs  Lab 12/02/17 1615 12/02/17 1952 12/02/17 2351 12/03/17 0101 12/03/17 0343  GLUCAP 108* 89  66 80 96   Lipid Profile: No results for input(s): CHOL, HDL, LDLCALC, TRIG, CHOLHDL, LDLDIRECT in the last 72 hours. Thyroid Function Tests: No results for input(s): TSH, T4TOTAL, FREET4, T3FREE, THYROIDAB in the last 72 hours. Anemia Panel: No results for input(s): VITAMINB12, FOLATE, FERRITIN, TIBC, IRON, RETICCTPCT in the last 72 hours. Sepsis Labs: Recent Labs  Lab 12/13/2017 1238 12/13/2017 1429 12/21/2017 2007 12/11/2017 2144  LATICACIDVEN 2.25* 2.87* 2.1* 3.3*    Recent Results (from the past 240 hour(s))  Blood Culture (routine x 2)     Status: None (Preliminary result)   Collection Time: 12/02/2017 12:20 PM  Result Value Ref Range Status   Specimen Description   Final    BLOOD RIGHT ANTECUBITAL Performed at [facility] [address], [place], [place] [address]    Special Requests   Final    BOTTLES DRAWN AEROBIC AND ANAEROBIC Blood Culture adequate volume Performed at [facility] [address] [place], [place] [address]     Culture   Final    NO GROWTH 3 DAYS Performed at [facility], [place] [address], [place], [place] [address]    Report Status PENDING  Incomplete  Blood Culture (routine x 2)     Status: None (Preliminary result)   Collection Time: 12/03/2017 12:37 PM  Result Value Ref Range Status   Specimen Description   Final    BLOOD RIGHT HAND Performed at [facility] [address], [place], [place] [address]    Special Requests   Final    IN PEDIATRIC BOTTLE Blood Culture adequate volume Performed at [facility] [address] [place], [place] [address]    Culture   Final    NO GROWTH 3 DAYS Performed at [facility], [place] [address] [place], [place] [address]    Report Status PENDING  Incomplete  Urine culture     Status: Abnormal   Collection Time: 12/14/2017  1:21 PM  Result Value Ref Range Status   Specimen Description   Final    URINE, RANDOM Performed at [facility] [address] [place], [place] [address]    Special Requests   Final    NONE Performed at [facility], [place] [address] [place],  [address]    Culture >=100,000 COLONIES/mL ESCHERICHIA COLI (A)  Final   Report Status 12/01/2017 FINAL  Final   Organism ID, Bacteria ESCHERICHIA COLI (A)  Final      Susceptibility   Escherichia coli - MIC*    AMPICILLIN 8 SENSITIVE Sensitive     CEFAZOLIN <=4 SENSITIVE Sensitive     CEFTRIAXONE <=1 SENSITIVE Sensitive     CIPROFLOXACIN <=0.25 SENSITIVE Sensitive     GENTAMICIN <=1 SENSITIVE Sensitive     IMIPENEM <=0.25 SENSITIVE Sensitive     NITROFURANTOIN 32 SENSITIVE Sensitive     TRIMETH/SULFA <=20 SENSITIVE Sensitive     AMPICILLIN/SULBACTAM 4 SENSITIVE Sensitive     PIP/TAZO <=4 SENSITIVE Sensitive     Extended ESBL NEGATIVE Sensitive     * >=100,000 COLONIES/mL ESCHERICHIA COLI  MRSA PCR Screening     Status: None   Collection Time: 12/06/2017 11:58 PM  Result Value Ref Range  Status   MRSA by PCR NEGATIVE NEGATIVE Final    Comment:        The GeneXpert MRSA Assay (FDA approved for NASAL specimens only), is one component of a comprehensive MRSA colonization surveillance program.  It is not intended to diagnose MRSA infection nor to guide or monitor treatment for MRSA infections. Performed at [facility], [place] [address] [place], [place] [address]   Respiratory Panel by PCR     Status: None   Collection Time: 11/30/17 11:44 AM  Result Value Ref Range Status   Adenovirus NOT DETECTED NOT DETECTED Final   Coronavirus 229E NOT DETECTED NOT DETECTED Final   Coronavirus HKU1 NOT DETECTED NOT DETECTED Final   Coronavirus NL63 NOT DETECTED NOT DETECTED Final   Coronavirus OC43 NOT DETECTED NOT DETECTED Final   Metapneumovirus NOT DETECTED NOT DETECTED Final   Rhinovirus / Enterovirus NOT DETECTED NOT DETECTED Final   Influenza A NOT DETECTED NOT DETECTED Final   Influenza B NOT DETECTED NOT DETECTED Final   Parainfluenza Virus 1 NOT DETECTED NOT DETECTED Final   Parainfluenza Virus 2 NOT DETECTED NOT DETECTED Final   Parainfluenza Virus 3 NOT DETECTED NOT DETECTED Final   Parainfluenza Virus 4 NOT DETECTED NOT DETECTED Final   Respiratory Syncytial Virus NOT DETECTED NOT DETECTED Final   Bordetella pertussis NOT DETECTED NOT DETECTED Final   Chlamydophila pneumoniae NOT DETECTED NOT DETECTED Final   Mycoplasma pneumoniae NOT DETECTED NOT DETECTED Final         Radiology Studies: Dg Chest Port 1 View  Result Date: 12/03/2017 CLINICAL DATA:  Acute respiratory failure. EXAM: PORTABLE CHEST 1 VIEW COMPARISON:  One day prior FINDINGS: Patient rotated right. Midline trachea. Normal heart size. Right paratracheal soft tissue fullness is similar. Left larger than right bilateral pleural effusions are small and similar. No pneumothorax. Bibasilar and right upper lobe airspace disease is not significantly changed. IMPRESSION: No significant  change since one day prior. Small bilateral pleural effusions and bibasilar airspace disease. Right upper lobe lung mass and right paratracheal adenopathy, as before. Electronically Signed   By: Abigail Miyamoto M.D.   On: 12/03/2017 07:11   Dg Chest Port 1 View  Result Date: 12/02/2017 CLINICAL DATA:  Acute respiratory failure. EXAM: PORTABLE CHEST 1 VIEW COMPARISON:  12/01/2017 FINDINGS: Endotracheal and enteric tubes have been removed. Abnormal right paratracheal soft tissue density corresponds to known upper lobe mass and lymphadenopathy. There are patchy left greater than right airspace opacities in the lung bases which have significantly improved from prior. Small bilateral pleural effusions also may have mildly decreased in size. Right upper lobe aeration also appear slightly improved. No pneumothorax is identified. IMPRESSION: 1. Interval extubation. Improved lung aeration with residual left greater than right basilar atelectasis or infiltrates. 2. Small bilateral pleural effusions. 3. Known right upper lobe mass and lymphadenopathy. Electronically Signed   By: Logan Bores M.D.   On: 12/02/2017 07:01        Scheduled Meds: . amLODipine  5 mg Oral Daily  . cycloSPORINE  1 drop Both Eyes QHS  . feeding supplement (PRO-STAT SUGAR FREE 64)  30 mL Per Tube Daily  . heparin  5,000 Units Subcutaneous Q8H  . insulin aspart  0-9 Units Subcutaneous Q4H  . levothyroxine  100 mcg Oral QAC breakfast  . LORazepam  0.5 mg Oral QHS  . mouth rinse  15 mL Mouth Rinse BID  . pantoprazole (PROTONIX) IV  40 mg Intravenous Q24H  . polyethylene glycol  17 g Oral Daily  . polyvinyl alcohol  1 drop Both Eyes Daily  . potassium chloride  20 mEq Per Tube Once   Continuous Infusions: . cefTRIAXone (ROCEPHIN)  IV 2 g (12/03/17 1043)  . doxycycline (  VIBRAMYCIN) IV 100 mg (12/03/17 1157)  . lactated ringers 10 mL/hr at 12/03/17 0600  . potassium PHOSPHATE IVPB (mEq) 10 mEq (12/03/17 0851)     LOS: 4 days     Time spent: More than 50% of that time was spent in counseling and/or coordination of care.      Marene Lenz, MD [facility] Pager [phone]  If 7PM-7AM, please contact night-coverage [email or web address] Password [email or web address] 12/03/2017, 12:02 PM

## 2017-12-03 NOTE — Progress Notes (Signed)
Pharmacy Antibiotic Note  Carol Powers is a 77 y.o. female admitted on 12/24/2017 with weakness, AMS, and recent (11/27/17) CT with  RUL mass and adrenal masses concerning for metastatic lung cancer.  She was initially on Ceftriaxone and Doxycyline for CAP and UTI.  Pharmacy has now been consulted for Zosyn dosing for sepsis, postobstructive pna, ongoing leukocytosis, and fever despite current abx.  Today, 12/03/2017: Tm 100.9 (improved from > 105 initially, but range is 99.1-100.9) WBC inc to 16.6 (no steroids) SCr 0.83 with CrCl ~ 52 ml/min     Plan:  Zosyn 3.375g IV Q8H infused over 4hrs.  Follow up renal fxn, culture results, and clinical course.    Height: 5\' 1"  (154.9 cm) Weight: 157 lb 3 oz (71.3 kg) IBW/kg (Calculated) : 47.8  Temp (24hrs), Avg:100.2 F (37.9 C), Min:99.7 F (37.6 C), Max:100.9 F (38.3 C)  Recent Labs  Lab 11/28/2017 1133 12/03/2017 1238 12/22/2017 1429 11/24/2017 2007 12/13/2017 2144 11/30/17 0500 12/01/17 0500 12/02/17 0531 12/03/17 0434 12/03/17 0829  WBC 16.4*  --   --   --   --  21.9* 12.7* 16.9*  --  16.6*  CREATININE 2.22* 2.20*  --   --   --  1.36* 1.11* 0.88 0.83  --   LATICACIDVEN  --  2.25* 2.87* 2.1* 3.3*  --   --   --   --   --     Estimated Creatinine Clearance: 52.1 mL/min (by C-G formula based on SCr of 0.83 mg/dL).    Allergies  Allergen Reactions  . Beta Adrenergic Blockers Other (See Comments)    Nightmares  . Codeine Nausea And Vomiting    severe  . Dexilant [Dexlansoprazole]     Rapid heart rate   . Erythromycin     Messed liver up  . Septra [Sulfamethoxazole-Trimethoprim]     Blisters     Antimicrobials this admission: 3/6 vanc/zosyn x1 3/6 CTX>> 3/10 3/6 Doxy >> 3/10 3/10 Zosyn >>   Dose adjustments this admission:   Microbiology results:  3/6 BCx x2: ngtd 3/6 UCx: > 100k Ecoli (pan-sens) 3/6 MRSA PCR: neg 3/6 ur strep pneu: neg 3/6 legionella: neg 3/6 UA: many bacteria, large leuk 3/7 Resp PCR panel:  none detected 3/10 BCx:   Thank you for allowing pharmacy to be a part of this patient's care.  Gretta Arab PharmD, BCPS Pager [phone] 12/03/2017 12:28 PM

## 2017-12-03 NOTE — Progress Notes (Signed)
Notified Dr. Vaughan Browner of increased fever, and tylenol given po, and antibiotics changed to Zosyn today.  Had to increase O2 from 8L/[place] to 10L/.  Notified Dr. Tawanna Solo of changes with patient and notified Dr. Vaughan Browner of changes with patient.  Continue to monitor patient closely.  Neelah Mannings Roselie Awkward RN

## 2017-12-04 ENCOUNTER — Inpatient Hospital Stay (HOSPITAL_COMMUNITY): Payer: Medicare Other | Admitting: Anesthesiology

## 2017-12-04 ENCOUNTER — Inpatient Hospital Stay (HOSPITAL_COMMUNITY): Payer: Medicare Other

## 2017-12-04 DIAGNOSIS — J9602 Acute respiratory failure with hypercapnia: Secondary | ICD-10-CM

## 2017-12-04 DIAGNOSIS — J189 Pneumonia, unspecified organism: Secondary | ICD-10-CM

## 2017-12-04 DIAGNOSIS — J9601 Acute respiratory failure with hypoxia: Secondary | ICD-10-CM

## 2017-12-04 DIAGNOSIS — J9600 Acute respiratory failure, unspecified whether with hypoxia or hypercapnia: Secondary | ICD-10-CM

## 2017-12-04 LAB — CBC WITH DIFFERENTIAL/PLATELET
BASOS PCT: 1 %
Basophils Absolute: 0.2 10*3/uL — ABNORMAL HIGH (ref 0.0–0.1)
EOS ABS: 0.2 10*3/uL (ref 0.0–0.7)
Eosinophils Relative: 1 %
HCT: 21.2 % — ABNORMAL LOW (ref 36.0–46.0)
Hemoglobin: 7 g/dL — ABNORMAL LOW (ref 12.0–15.0)
Lymphocytes Relative: 12 %
Lymphs Abs: 2.3 10*3/uL (ref 0.7–4.0)
MCH: 28.5 pg (ref 26.0–34.0)
MCHC: 33 g/dL (ref 30.0–36.0)
MCV: 86.2 fL (ref 78.0–100.0)
MONO ABS: 1.3 10*3/uL — AB (ref 0.1–1.0)
Monocytes Relative: 7 %
NEUTROS PCT: 79 %
Neutro Abs: 15.2 10*3/uL — ABNORMAL HIGH (ref 1.7–7.7)
PLATELETS: 212 10*3/uL (ref 150–400)
RBC: 2.46 MIL/uL — ABNORMAL LOW (ref 3.87–5.11)
RDW: 16.9 % — AB (ref 11.5–15.5)
WBC: 19.2 10*3/uL — AB (ref 4.0–10.5)

## 2017-12-04 LAB — BLOOD GAS, ARTERIAL
Acid-base deficit: 5.6 mmol/L — ABNORMAL HIGH (ref 0.0–2.0)
Bicarbonate: 20.3 mmol/L (ref 20.0–28.0)
Drawn by: 257881
FIO2: 100
MECHVT: 400 mL
O2 Saturation: 97.8 %
PEEP: 5 cmH2O
Patient temperature: 98.6
RATE: 20 resp/min
pCO2 arterial: 43.3 mmHg (ref 32.0–48.0)
pH, Arterial: 7.294 — ABNORMAL LOW (ref 7.350–7.450)
pO2, Arterial: 126 mmHg — ABNORMAL HIGH (ref 83.0–108.0)

## 2017-12-04 LAB — TROPONIN I
Troponin I: 0.04 ng/mL (ref <0.03)
Troponin I: 0.03 ng/mL (ref <0.03)
Troponin I: 0.04 ng/mL (ref <0.03)

## 2017-12-04 LAB — GLUCOSE, CAPILLARY
GLUCOSE-CAPILLARY: 106 mg/dL — AB (ref 65–99)
GLUCOSE-CAPILLARY: 129 mg/dL — AB (ref 65–99)
GLUCOSE-CAPILLARY: 74 mg/dL (ref 65–99)
GLUCOSE-CAPILLARY: 190 mg/dL — AB (ref 65–99)
GLUCOSE-CAPILLARY: 75 mg/dL (ref 65–99)
GLUCOSE-CAPILLARY: 78 mg/dL (ref 65–99)
GLUCOSE-CAPILLARY: 63 mg/dL — AB (ref 65–99)
Glucose-Capillary: 102 mg/dL — ABNORMAL HIGH (ref 65–99)
Glucose-Capillary: 135 mg/dL — ABNORMAL HIGH (ref 65–99)
Glucose-Capillary: 31 mg/dL — CL (ref 65–99)
Glucose-Capillary: 56 mg/dL — ABNORMAL LOW (ref 65–99)
Glucose-Capillary: 58 mg/dL — ABNORMAL LOW (ref 65–99)
Glucose-Capillary: 67 mg/dL (ref 65–99)
Glucose-Capillary: 71 mg/dL (ref 65–99)
Glucose-Capillary: 72 mg/dL (ref 65–99)
Glucose-Capillary: 91 mg/dL (ref 65–99)

## 2017-12-04 LAB — BLOOD CULTURE ID PANEL (REFLEXED)
Acinetobacter baumannii: NOT DETECTED
CANDIDA ALBICANS: NOT DETECTED
CANDIDA PARAPSILOSIS: NOT DETECTED
CANDIDA TROPICALIS: NOT DETECTED
Candida glabrata: NOT DETECTED
Candida krusei: NOT DETECTED
ENTEROBACTERIACEAE SPECIES: NOT DETECTED
Enterobacter cloacae complex: NOT DETECTED
Enterococcus species: NOT DETECTED
Escherichia coli: NOT DETECTED
HAEMOPHILUS INFLUENZAE: NOT DETECTED
KLEBSIELLA OXYTOCA: NOT DETECTED
KLEBSIELLA PNEUMONIAE: NOT DETECTED
Listeria monocytogenes: NOT DETECTED
METHICILLIN RESISTANCE: NOT DETECTED
Neisseria meningitidis: NOT DETECTED
PROTEUS SPECIES: NOT DETECTED
Pseudomonas aeruginosa: NOT DETECTED
STREPTOCOCCUS PYOGENES: NOT DETECTED
Serratia marcescens: NOT DETECTED
Staphylococcus aureus (BCID): NOT DETECTED
Staphylococcus species: DETECTED — AB
Streptococcus agalactiae: NOT DETECTED
Streptococcus pneumoniae: NOT DETECTED
Streptococcus species: NOT DETECTED

## 2017-12-04 LAB — MAGNESIUM: Magnesium: 1.3 mg/dL — ABNORMAL LOW (ref 1.7–2.4)

## 2017-12-04 LAB — CULTURE, BLOOD (ROUTINE X 2)
Culture: NO GROWTH
Culture: NO GROWTH
SPECIAL REQUESTS: ADEQUATE
Special Requests: ADEQUATE

## 2017-12-04 LAB — PROCALCITONIN: Procalcitonin: 8.62 ng/mL

## 2017-12-04 LAB — PREPARE RBC (CROSSMATCH)

## 2017-12-04 LAB — ABO/RH: ABO/RH(D): A POS

## 2017-12-04 LAB — LACTIC ACID, PLASMA
Lactic Acid, Venous: 2.3 mmol/L (ref 0.5–1.9)
Lactic Acid, Venous: 1.8 mmol/L (ref 0.5–1.9)

## 2017-12-04 LAB — CORTISOL: CORTISOL PLASMA: 12.2 ug/dL

## 2017-12-04 LAB — PHOSPHORUS: Phosphorus: 1.9 mg/dL — ABNORMAL LOW (ref 2.5–4.6)

## 2017-12-04 MED ORDER — FENTANYL CITRATE (PF) 100 MCG/2ML IJ SOLN
50.0000 ug | INTRAMUSCULAR | Status: DC | PRN
  Administered 2017-12-04: 50 ug via INTRAVENOUS
  Filled 2017-12-04: qty 2

## 2017-12-04 MED ORDER — DEXTROSE 5 % IV SOLN
INTRAVENOUS | Status: DC
  Administered 2017-12-04 – 2017-12-05 (×3): via INTRAVENOUS

## 2017-12-04 MED ORDER — ACETAMINOPHEN 650 MG RE SUPP: 650.0000 mg | Freq: Four times a day (QID) | RECTAL | Status: DC | PRN

## 2017-12-04 MED ORDER — NOREPINEPHRINE BITARTRATE 1 MG/ML IV SOLN
0.0000 ug/min | INTRAVENOUS | Status: DC
  Filled 2017-12-04: qty 4

## 2017-12-04 MED ORDER — DEXTROSE 50 % IV SOLN
INTRAVENOUS | Status: AC
  Administered 2017-12-04: 50 mL
  Filled 2017-12-04: qty 50

## 2017-12-04 MED ORDER — SODIUM CHLORIDE 0.9 % IV SOLN: Freq: Once | INTRAVENOUS | Status: DC

## 2017-12-04 MED ORDER — ETOMIDATE 2 MG/ML IV SOLN
INTRAVENOUS | Status: DC | PRN
  Administered 2017-12-04: 20 mg via INTRAVENOUS

## 2017-12-04 MED ORDER — FENTANYL CITRATE (PF) 100 MCG/2ML IJ SOLN
50.0000 ug | INTRAMUSCULAR | Status: DC | PRN
  Administered 2017-12-04 (×2): 50 ug via INTRAVENOUS
  Filled 2017-12-04 (×2): qty 2

## 2017-12-04 MED ORDER — KETOROLAC TROMETHAMINE 30 MG/ML IJ SOLN
15.0000 mg | Freq: Once | INTRAMUSCULAR | Status: AC
  Administered 2017-12-04: 15 mg via INTRAVENOUS
  Filled 2017-12-04: qty 1

## 2017-12-04 MED ORDER — MAGNESIUM SULFATE 4 GM/100ML IV SOLN
4.0000 g | Freq: Once | INTRAVENOUS | Status: AC
  Administered 2017-12-04: 4 g via INTRAVENOUS
  Filled 2017-12-04: qty 100

## 2017-12-04 MED ORDER — SODIUM CHLORIDE 0.9 % IV BOLUS (SEPSIS)
1000.0000 mL | Freq: Once | INTRAVENOUS | Status: AC
  Administered 2017-12-04: 1000 mL via INTRAVENOUS

## 2017-12-04 MED ORDER — FENTANYL CITRATE (PF) 100 MCG/2ML IJ SOLN
INTRAMUSCULAR | Status: AC
  Administered 2017-12-04: 10:00:00
  Filled 2017-12-04: qty 2

## 2017-12-04 MED ORDER — SUCCINYLCHOLINE CHLORIDE 200 MG/10ML IV SOSY
PREFILLED_SYRINGE | INTRAVENOUS | Status: DC | PRN
  Administered 2017-12-04: 100 mg via INTRAVENOUS

## 2017-12-04 MED ORDER — FENTANYL CITRATE (PF) 100 MCG/2ML IJ SOLN
50.0000 ug | INTRAMUSCULAR | Status: DC | PRN
  Administered 2017-12-05 – 2017-12-07 (×2): 100 ug via INTRAVENOUS
  Administered 2017-12-07: 50 ug via INTRAVENOUS
  Administered 2017-12-08: 100 ug via INTRAVENOUS
  Administered 2017-12-08: 50 ug via INTRAVENOUS
  Administered 2017-12-09 (×3): 100 ug via INTRAVENOUS
  Filled 2017-12-04 (×10): qty 2

## 2017-12-04 MED ORDER — CHLORHEXIDINE GLUCONATE 0.12% ORAL RINSE (MEDLINE KIT)
15.0000 mL | Freq: Two times a day (BID) | OROMUCOSAL | Status: DC
  Administered 2017-12-05 – 2017-12-09 (×9): 15 mL via OROMUCOSAL

## 2017-12-04 MED ORDER — PHENYLEPHRINE HCL-NACL 10-0.9 MG/250ML-% IV SOLN
0.0000 ug/min | INTRAVENOUS | Status: DC
  Administered 2017-12-04: 250 ug/min via INTRAVENOUS
  Administered 2017-12-04: 300 ug/min via INTRAVENOUS
  Administered 2017-12-04: 400 ug/min via INTRAVENOUS
  Administered 2017-12-04: 300 ug/min via INTRAVENOUS
  Administered 2017-12-04: 200 ug/min via INTRAVENOUS
  Administered 2017-12-04: 20 ug/min via INTRAVENOUS
  Filled 2017-12-04 (×6): qty 250

## 2017-12-04 MED ORDER — MIDAZOLAM HCL 2 MG/2ML IJ SOLN: 1.0000 mg | Freq: Once | INTRAMUSCULAR | Status: DC

## 2017-12-04 MED ORDER — SUCCINYLCHOLINE CHLORIDE 200 MG/10ML IV SOSY
PREFILLED_SYRINGE | INTRAVENOUS | Status: AC
  Filled 2017-12-04: qty 10

## 2017-12-04 MED ORDER — MIDAZOLAM HCL 2 MG/2ML IJ SOLN
INTRAMUSCULAR | Status: AC
  Filled 2017-12-04: qty 2

## 2017-12-04 MED ORDER — ETOMIDATE 2 MG/ML IV SOLN
INTRAVENOUS | Status: AC
  Filled 2017-12-04: qty 10

## 2017-12-04 MED ORDER — POTASSIUM CHLORIDE 20 MEQ/15ML (10%) PO SOLN
40.0000 meq | Freq: Once | ORAL | Status: AC
  Administered 2017-12-04: 40 meq
  Filled 2017-12-04: qty 30

## 2017-12-04 MED ORDER — SODIUM CHLORIDE 0.9 % IV SOLN
INTRAVENOUS | Status: DC
  Administered 2017-12-04: 10:00:00 via INTRAVENOUS

## 2017-12-04 MED ORDER — VANCOMYCIN HCL 10 G IV SOLR
1500.0000 mg | Freq: Once | INTRAVENOUS | Status: AC
  Administered 2017-12-04: 1500 mg via INTRAVENOUS
  Filled 2017-12-04: qty 1500

## 2017-12-04 MED ORDER — NOREPINEPHRINE 4 MG/250ML-% IV SOLN
0.0000 ug/min | INTRAVENOUS | Status: DC
  Administered 2017-12-04: 10 ug/min via INTRAVENOUS
  Administered 2017-12-05: 6 ug/min via INTRAVENOUS
  Administered 2017-12-05: 2 ug/min via INTRAVENOUS
  Administered 2017-12-07: 30 ug/min via INTRAVENOUS
  Administered 2017-12-07: 4 ug/min via INTRAVENOUS
  Administered 2017-12-07: 17 ug/min via INTRAVENOUS
  Administered 2017-12-07: 20 ug/min via INTRAVENOUS
  Administered 2017-12-07: 26 ug/min via INTRAVENOUS
  Administered 2017-12-08: 10 ug/min via INTRAVENOUS
  Filled 2017-12-04 (×10): qty 250

## 2017-12-04 MED ORDER — DEXTROSE 50 % IV SOLN
INTRAVENOUS | Status: AC
  Administered 2017-12-04: 25 mL
  Filled 2017-12-04: qty 50

## 2017-12-04 MED ORDER — CHLORHEXIDINE GLUCONATE CLOTH 2 % EX PADS
6.0000 | MEDICATED_PAD | Freq: Every day | CUTANEOUS | Status: DC
  Administered 2017-12-05 – 2017-12-08 (×2): 6 via TOPICAL

## 2017-12-04 MED ORDER — VITAL HIGH PROTEIN PO LIQD
1000.0000 mL | ORAL | Status: DC
  Administered 2017-12-04: 1000 mL
  Filled 2017-12-04 (×2): qty 1000

## 2017-12-04 MED ORDER — POTASSIUM PHOSPHATES 15 MMOLE/5ML IV SOLN
30.0000 mmol | Freq: Once | INTRAVENOUS | Status: AC
  Administered 2017-12-04: 30 mmol via INTRAVENOUS
  Filled 2017-12-04: qty 10

## 2017-12-04 MED ORDER — VANCOMYCIN HCL IN DEXTROSE 750-5 MG/150ML-% IV SOLN
750.0000 mg | INTRAVENOUS | Status: DC
  Filled 2017-12-04: qty 150

## 2017-12-04 NOTE — Procedures (Signed)
Bronchoscopy Procedure Note RENDI MAPEL [id number] September 23, 1941  Procedure: Bronchoscopy Indications: Obtain specimens for culture and/or other diagnostic studies  Procedure Details Consent: Risks of procedure as well as the alternatives and risks of each were explained to the (patient/caregiver).  Consent for procedure obtained. Time Out: Verified patient identification, verified procedure, site/side was marked, verified correct patient position, special equipment/implants available, medications/allergies/relevent history reviewed, required imaging and test results available.  Performed  In preparation for procedure, patient was given 100% FiO2, bronchoscope lubricated and inhaled beta agonist administered. Sedation: fentanyl 100 mcg in divided doses  Airway entered and the following bronchi were examined: Bronchi.  RUL mass seen obstructing RUL after take off Procedures performed: BAL, Brushings , endobronchial biopsy of RUL mass performed Bronchoscope removed.  , Patient placed back on 100% FiO2 at conclusion of procedure.    Evaluation Hemodynamic Status: BP stable throughout; O2 sats: stable throughout Patient's Current Condition: stable Specimens:  Sent purulent fluid  BAL for cx, afb & fungal BAL & brushings for cytology Endobronchial bx for path  Complications: No apparent complications Patient did tolerate procedure well.   Leanna Sato Elsworth [place] 12/04/2017

## 2017-12-04 NOTE — Procedures (Signed)
Central Venous Catheter Insertion Procedure Note Carol Powers [id number] 11/13/1940  Procedure: Insertion of Central Venous Catheter Indications: Assessment of intravascular volume, Drug and/or fluid administration and Frequent blood sampling  Procedure Details Consent: Risks of procedure as well as the alternatives and risks of each were explained to the (patient/caregiver).  Consent for procedure obtained. Time Out: Verified patient identification, verified procedure, site/side was marked, verified correct patient position, special equipment/implants available, medications/allergies/relevent history reviewed, required imaging and test results available.  Performed  Real time [place] used to ID and cannulate vessel   Maximum sterile technique was used including antiseptics, cap, gloves, gown, hand hygiene, mask and sheet. Skin prep: Chlorhexidine; local anesthetic administered A antimicrobial bonded/coated triple lumen catheter was placed in the right internal jugular vein using the Seldinger technique.  Evaluation Blood flow good Complications: No apparent complications Patient did tolerate procedure well. Chest X-ray ordered to verify placement.  CXR: pending.  Clementeen Graham 12/04/2017, 8:52 AM  Erick Colace ACNP-BC [place] Pager # [phone] OR # [phone] if no answer

## 2017-12-04 NOTE — Progress Notes (Signed)
Video Bronchoscopy done  Intervention Bronchial washing done Intervention Bronchial biopsy done Intervention Bronchial brushings done

## 2017-12-04 NOTE — Progress Notes (Signed)
PROGRESS NOTE    Carol Powers  GMW:[id number] DOB: 11-14-1940 DOA: 12/08/2017 PCP: Lavone Orn, MD   Brief Narrative: Patient is a 77 year old female with past medical history of hypothyroidism, former smoker who presented to the emergency department on 11/28/2017 with altered mental status.  Patient was also evaluated by her PCP as an outpatient with a chest x-ray which was abnormal.  Patient also underwent CT of the chest on 11/27/17 which demonstrated a 6.  1 cm posterior right upper lobe mass concerning for primary bronchogenic neoplasm.  Mass was found to be extending to the right perihilar region.  Also found to have mediastinal and right hilar nodal metastasis.  It also showed bilateral adrenal masses measuring up to 5.62 cm on the right side worrisome for metastatic disease.  Several scattered hepatic lesions measuring up to 11 cm were also noted on the CT scan concerning for metastatic lesions.  Patient was planned to see Dr. Julien Nordmann for management and workup.  Patient developed severe generalized weakness, altered mental status and had to be brought to the emergency department.. Chest x-ray done in the emergency department showed right suprahilar mass with slight worsening density in the right upper lobe, left lower lobe.  Patient was also hypotensive.  Sepsis was suspected secondary to postoperative pneumonia and patient was admitted under ICU service.  Patient had to be intubated.  Started on pressors and IV fluids.  Extubated now.  Patient was transferred to hospital service on 12/03/17.  Patient became hypoxic this morning and had to be reintubated.  Patient also underwent bronchoscopy at the bedside.   Assessment & Plan:   Principal Problem:   Postobstructive pneumonia Active Problems:   Hypotension   Sepsis ([place])   Leukocytosis   Mass of right lung   Right adrenal mass (HCC)   Acute respiratory failure (HCC)  Acute hypoxic respiratory failure: Desaturated  this morning and  had to be intubated.  Septic shock :Likely secondary left lower lobe infiltrate secondary to postobstructive pneumonia.  Urine culture also grew E. Coli. Patient is off pressors.  Her blood pressure is currently stable. . Changed antibiotics to Vanco and Zosyn today.  Blood cultures are negative so far.  Postoperative pneumonia: Continue antibiotics as above.    Urinary tract infection: Urine culture grew E. Coli.  Continue antibiotic.  Right lung mass: New diagnosis as of 11/27/17.  Remote former smoker.  Needed intubation on presentation. CT chest findings:6.1 cm posterior right upper lobe mass, compatible with primary bronchogenic neoplasm, as above. Mass extends to the right perihilar region and abuts the right major fissure. Mediastinal and right hilar nodal metastases. Bilateral adrenal masses, measuring up to 5.2 cm on the right, worrisome for metastatic disease. Scattered hepatic lesions measuring up to 11 mm, indeterminate but worrisome for metastases.  Underwent bronchoscopy today at the bedside. Plna is to consult IR to see if liver, adrenal lesions can be biopsied if bronch is  negative  Acute kidney injury: Continue fluids  Hypothyroidism: Continue Synthyroid.  Depression: Continue Zoloft.  Hypokalemia/hypophosphatemia/hypomagnesemia:Being  Supplemented.Continue to monitor the levels  We will sign off and  PCCM will take over.  DVT prophylaxis: hep SQ Code Status: Full Family Communication: Family present at the bedside  disposition Plan: Undetermined at this point   Consultants: PCCM  Procedures: Intubation today and  on  12/04/2017  Antimicrobials: Zosyn since 12/03/17,Vancomycin since 12/04/17 Ceftriaxone and doxycycline: 11/30/17 to 12/03/17  Subjective: Patient seen and examined the bedside this morning.  Patient had to  be urgently intubated for desaturation.  Blood pressure improved with IV fluids.  Objective: Vitals:   12/04/17 0743 12/04/17 0800 12/04/17  0900 12/04/17 1048  BP: (!) 119/47 (!) 148/61 (!) 107/45 (!) 106/44  Pulse: 99 100 88 87  Resp: 16 16 (!) 22 (!) 22  Temp:  98.4 F (36.9 C) 97.9 F (36.6 C)   TempSrc:      SpO2:  93% 100% 100%  Weight:      Height:        Intake/Output Summary (Last 24 hours) at 12/04/2017 1056 Last data filed at 12/04/2017 0705 Gross per 24 hour  Intake 968.25 ml  Output 2270 ml  Net -1301.75 ml   Filed Weights   12/11/2017 2154 12/02/17 0404 12/03/17 0500  Weight: 64.4 kg (142 lb) 71.9 kg (158 lb 8.2 oz) 71.3 kg (157 lb 3 oz)    Examination:  General exam: In respiratory distress, critically ill HEENT:PERRL,Oral mucosa moist, Ear/Nose normal on gross exam Respiratory system: Bilateral decreased air entry  cardiovascular system: S1 & S2 heard, RRR. No JVD, murmurs, rubs, gallops or clicks.Hypotensive Gastrointestinal system: Abdomen is nondistended, soft and nontender. No organomegaly or masses felt. Normal bowel sounds heard. Central nervous system: Not Alert and oriented. No focal neurological deficits. Extremities: No edema, no clubbing ,no cyanosis, distal peripheral pulses palpable. Skin: No rashes, lesions or ulcers,no icterus ,no pallor   Data Reviewed: I have personally reviewed following labs and imaging studies  CBC: Recent Labs  Lab 12/22/2017 1133  11/30/17 0500 12/01/17 0500 12/02/17 0531 12/03/17 0829 12/04/17 0659  WBC 16.4*  --  21.9* 12.7* 16.9* 16.6* 19.2*  NEUTROABS 14.4*  --   --   --   --   --  15.2*  HGB 11.0*   < > 8.5* 7.8* 9.3* 9.1* 7.0*  HCT 32.7*   < > 25.7* 22.4* 27.4* 27.4* 21.2*  MCV 83.4  --  85.1 83.3 81.3 82.0 86.2  PLT 461*  --  345 308 346 177 212   < > = values in this interval not displayed.   Basic Metabolic Panel: Recent Labs  Lab 11/30/17 0500 12/01/17 0500 12/02/17 0531 12/03/17 0434 12/04/17 0659  NA 129* 133* 139 143 133*  K 3.6 3.3* 2.5* 3.0* 2.3*  CL 106 108 107 111 107  CO2 14* 16* 22 21* 21*  GLUCOSE 243* 142* 107* 83 335*   BUN 27* 20 24* 25* 22*  CREATININE 1.36* 1.11* 0.88 0.83 1.04*  CALCIUM 7.7* 8.2* 8.7* 9.0 7.0*  MG 1.4* 1.9 1.8 1.7 1.3*  PHOS 2.7  --  2.3* 1.2* 1.9*   GFR: Estimated Creatinine Clearance: 41.6 mL/min (A) (by C-G formula based on SCr of 1.04 mg/dL (H)). Liver Function Tests: Recent Labs  Lab 12/20/2017 1133  AST 38  ALT 17  ALKPHOS 98  BILITOT 1.1  PROT 6.8  ALBUMIN 3.1*   No results for input(s): LIPASE, AMYLASE in the last 168 hours. No results for input(s): AMMONIA in the last 168 hours. Coagulation Profile: No results for input(s): INR, PROTIME in the last 168 hours. Cardiac Enzymes: Recent Labs  Lab 12/04/17 0659  TROPONINI 0.04*   BNP (last 3 results) No results for input(s): PROBNP in the last 8760 hours. HbA1C: No results for input(s): HGBA1C in the last 72 hours. CBG: Recent Labs  Lab 12/04/17 0520 12/04/17 0550 12/04/17 0610 12/04/17 0710 12/04/17 0828  GLUCAP 58* 74 129* 190* 67   Lipid Profile: No results for input(s): CHOL,  HDL, LDLCALC, TRIG, CHOLHDL, LDLDIRECT in the last 72 hours. Thyroid Function Tests: No results for input(s): TSH, T4TOTAL, FREET4, T3FREE, THYROIDAB in the last 72 hours. Anemia Panel: No results for input(s): VITAMINB12, FOLATE, FERRITIN, TIBC, IRON, RETICCTPCT in the last 72 hours. Sepsis Labs: Recent Labs  Lab 12/03/2017 1429 12/06/2017 2007 12/24/2017 2144 12/04/17 0659  PROCALCITON  --   --   --  8.62  LATICACIDVEN 2.87* 2.1* 3.3* 2.3*    Recent Results (from the past 240 hour(s))  Blood Culture (routine x 2)     Status: None (Preliminary result)   Collection Time: 11/30/2017 12:20 PM  Result Value Ref Range Status   Specimen Description   Final    BLOOD RIGHT ANTECUBITAL Performed at [facility] [address], [place], [place] [address]    Special Requests   Final    BOTTLES DRAWN AEROBIC AND ANAEROBIC Blood Culture adequate volume Performed at [facility] [address] [place], [place] [address]    Culture   Final    NO GROWTH 4 DAYS Performed at [facility], [place] [address] [place], [place] [address]    Report Status PENDING  Incomplete  Blood Culture (routine x 2)     Status: None (Preliminary result)   Collection Time: 12/13/2017 12:37 PM  Result Value Ref Range Status   Specimen Description   Final    BLOOD RIGHT HAND Performed at [facility] [address], [place], [place] [address]    Special Requests   Final    IN PEDIATRIC BOTTLE Blood Culture adequate volume Performed at [facility] [address], [place], [place] [address]    Culture   Final    NO GROWTH 4 DAYS Performed at [facility], [place] [address], [place], [place] [address]    Report Status PENDING  Incomplete  Urine culture     Status: Abnormal   Collection Time: 12/13/2017  1:21 PM  Result Value Ref Range Status   Specimen Description   Final    URINE, RANDOM Performed at [facility] [address] [place], [place] [address]    Special Requests   Final    NONE Performed at [facility], [place] [address] [place], [place] [address]    Culture >=100,000 COLONIES/mL ESCHERICHIA COLI (A)  Final   Report Status 12/01/2017 FINAL  Final   Organism ID, Bacteria ESCHERICHIA COLI (A)  Final      Susceptibility   Escherichia coli - MIC*    AMPICILLIN 8 SENSITIVE Sensitive     CEFAZOLIN <=4 SENSITIVE Sensitive     CEFTRIAXONE <=1 SENSITIVE Sensitive     CIPROFLOXACIN <=0.25 SENSITIVE Sensitive     GENTAMICIN <=1 SENSITIVE Sensitive     IMIPENEM <=0.25 SENSITIVE Sensitive     NITROFURANTOIN 32 SENSITIVE Sensitive     TRIMETH/SULFA <=20 SENSITIVE Sensitive     AMPICILLIN/SULBACTAM 4 SENSITIVE Sensitive     PIP/TAZO <=4 SENSITIVE Sensitive     Extended ESBL NEGATIVE Sensitive     * >=100,000 COLONIES/mL ESCHERICHIA COLI  MRSA PCR Screening     Status: None   Collection Time: 12/08/2017  11:58 PM  Result Value Ref Range Status   MRSA by PCR NEGATIVE NEGATIVE Final    Comment:        The GeneXpert MRSA Assay (FDA approved for NASAL specimens only), is one component of a comprehensive MRSA colonization surveillance program.  It is not intended to diagnose MRSA infection nor to guide or monitor treatment for MRSA infections. Performed at [facility], [place] [address], [place], [place] [address]   Respiratory Panel by PCR     Status: None   Collection Time: 11/30/17 11:44 AM  Result Value Ref Range Status   Adenovirus NOT DETECTED NOT DETECTED Final   Coronavirus 229E NOT DETECTED NOT DETECTED Final   Coronavirus HKU1 NOT DETECTED NOT DETECTED Final   Coronavirus NL63 NOT DETECTED NOT DETECTED Final   Coronavirus OC43 NOT DETECTED NOT DETECTED Final   Metapneumovirus NOT DETECTED NOT DETECTED Final   Rhinovirus / Enterovirus NOT DETECTED NOT DETECTED Final   Influenza A NOT DETECTED NOT DETECTED Final   Influenza B NOT DETECTED NOT DETECTED Final   Parainfluenza Virus 1 NOT DETECTED NOT DETECTED Final   Parainfluenza Virus 2 NOT DETECTED NOT DETECTED Final   Parainfluenza Virus 3 NOT DETECTED NOT DETECTED Final   Parainfluenza Virus 4 NOT DETECTED NOT DETECTED Final   Respiratory Syncytial Virus NOT DETECTED NOT DETECTED Final   Bordetella pertussis NOT DETECTED NOT DETECTED Final   Chlamydophila pneumoniae NOT DETECTED NOT DETECTED Final   Mycoplasma pneumoniae NOT DETECTED NOT DETECTED Final  Culture, blood (Routine X 2) w Reflex to ID Panel     Status: None (Preliminary result)   Collection Time: 12/03/17 12:04 AM  Result Value Ref Range Status   Specimen Description   Final    BLOOD RIGHT HAND Performed at [facility], [place] [address], [place], [place] [address]    Special Requests   Final    IN PEDIATRIC BOTTLE Blood Culture adequate volume Performed at [facility] [address] [place],  [place] [address]    Culture  Setup Time   Final    GRAM POSITIVE COCCI IN CLUSTERS IN PEDIATRIC BOTTLE CRITICAL RESULT CALLED TO, READ BACK BY AND VERIFIED WITH: A.PHAN,PHARMD AT 4580 ON 12/04/17 BY G.[email or web address]    Culture   Final    GRAM POSITIVE COCCI CULTURE REINCUBATED FOR BETTER GROWTH Performed at [facility], [place] [address], [place], [place] [address]    Report Status PENDING  Incomplete  Blood Culture ID Panel (Reflexed)     Status: Abnormal   Collection Time: 12/03/17 12:04 AM  Result Value Ref Range Status   Enterococcus species NOT DETECTED NOT DETECTED Final   Listeria monocytogenes NOT DETECTED NOT DETECTED Final   Staphylococcus species DETECTED (A) NOT DETECTED Final    Comment: Methicillin (oxacillin) susceptible coagulase negative staphylococcus. Possible blood culture contaminant (unless isolated from more than one blood culture draw or clinical case suggests pathogenicity). No antibiotic treatment is indicated for blood  culture contaminants. CRITICAL RESULT CALLED TO, READ BACK BY AND VERIFIED WITH: A.PHAN,PHARMD AT 8250 ON 12/04/17 BY G.[email or web address]    Staphylococcus aureus NOT DETECTED NOT DETECTED Final   Methicillin resistance NOT DETECTED NOT DETECTED Final   Streptococcus species NOT DETECTED NOT DETECTED Final   Streptococcus agalactiae NOT DETECTED NOT DETECTED Final   Streptococcus pneumoniae NOT DETECTED NOT DETECTED Final   Streptococcus pyogenes NOT DETECTED NOT DETECTED Final   Acinetobacter baumannii NOT DETECTED NOT DETECTED Final   Enterobacteriaceae species NOT DETECTED NOT DETECTED Final   Enterobacter cloacae complex NOT DETECTED NOT DETECTED Final   Escherichia coli NOT DETECTED NOT DETECTED Final   Klebsiella oxytoca NOT DETECTED NOT DETECTED Final   Klebsiella pneumoniae NOT DETECTED NOT DETECTED Final   Proteus species  NOT DETECTED NOT DETECTED Final   Serratia marcescens NOT DETECTED NOT DETECTED Final   Haemophilus influenzae NOT DETECTED NOT  DETECTED Final   Neisseria meningitidis NOT DETECTED NOT DETECTED Final   Pseudomonas aeruginosa NOT DETECTED NOT DETECTED Final   Candida albicans NOT DETECTED NOT DETECTED Final   Candida glabrata NOT DETECTED NOT DETECTED Final   Candida krusei NOT DETECTED NOT DETECTED Final   Candida parapsilosis NOT DETECTED NOT DETECTED Final   Candida tropicalis NOT DETECTED NOT DETECTED Final    Comment: Performed at [facility], [place] [address], [place], [place] [address]         Radiology Studies: Dg Chest 1 View  Result Date: 12/04/2017 CLINICAL DATA:  77 year old female with endotracheal tube placement. Subsequent encounter. EXAM: CHEST 1 VIEW COMPARISON:  12/04/2017 6:07 a.m. chest x-ray.  11/27/2017 chest CT. FINDINGS: Endotracheal tube tip 2.5 cm above the carina. Nasogastric tube courses below the diaphragm. Tip is not included on the present exam. Right hilar/upper lobe mass with obstruction and atelectasis right upper lobe. Bilateral pleural effusions with basilar consolidation suspicious for atelectasis with infiltrate less likely consideration. Stable mild central pulmonary vascular prominence. IMPRESSION: Endotracheal tube tip 2.5 cm above the carina. Right hilar/upper lobe mass with obstruction and atelectasis of the right upper lobe. Bilateral pleural effusions with basilar consolidation suspicious for atelectasis with infiltrate less likely consideration. Electronically Signed   By: Genia Del M.D.   On: 12/04/2017 08:31   Dg Abd 1 View  Result Date: 12/04/2017 CLINICAL DATA:  77 year old female with gastric tube placement. Subsequent encounter. EXAM: ABDOMEN - 1 VIEW COMPARISON:  12/14/2017. FINDINGS: Nasogastric tube tip gastric body level. Side hole fundus-body junction. Nonspecific calcifications mid aspect left abdomen just left of midline possibly pancreatic in origin. Post cholecystectomy. No bowel obstruction. The possibility of free intraperitoneal air cannot be  assessed on a supine view. IMPRESSION: Nasogastric tube tip gastric body level. Side hole fundus-body junction. No evidence of bowel obstruction. Nonspecific calcifications mid aspect left abdomen just left of midline possibly pancreatic in origin. Electronically Signed   By: Genia Del M.D.   On: 12/04/2017 08:34   Portable Chest X-ray  Result Date: 12/04/2017 CLINICAL DATA:  77 year old female with right perihilar lung mass, shortness of breath, intubated and enteric tube placed. Central line placement. EXAM: PORTABLE CHEST 1 VIEW COMPARISON:  0803 hours today and earlier. FINDINGS: Portable AP semi upright view at 0846 hours. Right IJ approach central line placed. Catheter tip projects about 1 centimeter below the expected cavoatrial junction level. No pneumothorax. Continued right upper lobe collapse and/or consolidation now obscuring the right hilar and paratracheal masslike opacity seen in February. Stable endotracheal tube and visible enteric tube. Small bilateral pleural effusions are suspected and stable. Other mediastinal contours are stable. Stable cholecystectomy clips. Paucity bowel gas in the upper abdomen. IMPRESSION: 1. Right IJ central line placed, the tip projects about 1 cm below the cavoatrial junction level. No pneumothorax. 2. Right hilar lung mass with right upper collapse and/or consolidation. 3. Small bilateral pleural effusions. No new cardiopulmonary abnormality. Electronically Signed   By: Genevie Ann M.D.   On: 12/04/2017 09:12   Dg Chest Port 1 View  Result Date: 12/04/2017 CLINICAL DATA:  Acute onset of shortness of breath. EXAM: PORTABLE CHEST 1 VIEW COMPARISON:  Chest radiograph performed 12/03/2017 FINDINGS: New right upper lobe and worsening left basilar airspace opacities are compatible with worsening pneumonia. A small left pleural effusion is suspected. No pneumothorax is  seen. The cardiomediastinal silhouette is borderline normal in size. No acute osseous abnormalities  are identified. IMPRESSION: Worsening bilateral pneumonia noted. Suspect small left pleural effusion. Followup PA and lateral chest X-ray is recommended in 3-4 weeks following trial of antibiotic therapy to ensure resolution and exclude underlying malignancy. Electronically Signed   By: Garald Balding M.D.   On: 12/04/2017 06:24   Dg Chest Port 1 View  Result Date: 12/03/2017 CLINICAL DATA:  Acute respiratory failure. EXAM: PORTABLE CHEST 1 VIEW COMPARISON:  One day prior FINDINGS: Patient rotated right. Midline trachea. Normal heart size. Right paratracheal soft tissue fullness is similar. Left larger than right bilateral pleural effusions are small and similar. No pneumothorax. Bibasilar and right upper lobe airspace disease is not significantly changed. IMPRESSION: No significant change since one day prior. Small bilateral pleural effusions and bibasilar airspace disease. Right upper lobe lung mass and right paratracheal adenopathy, as before. Electronically Signed   By: Abigail Miyamoto M.D.   On: 12/03/2017 07:11        Scheduled Meds: . cycloSPORINE  1 drop Both Eyes QHS  . feeding supplement (PRO-STAT SUGAR FREE 64)  30 mL Per Tube Daily  . heparin  5,000 Units Subcutaneous Q8H  . insulin aspart  0-9 Units Subcutaneous Q4H  . levothyroxine  100 mcg Oral QAC breakfast  . mouth rinse  15 mL Mouth Rinse BID  . midazolam  1 mg Intravenous Once  . pantoprazole (PROTONIX) IV  40 mg Intravenous Q24H  . polyethylene glycol  17 g Oral Daily  . polyvinyl alcohol  1 drop Both Eyes Daily   Continuous Infusions: . sodium chloride 100 mL/hr at 12/04/17 0937  . sodium chloride    . dextrose 50 mL/hr at 12/04/17 0541  . lactated ringers 10 mL/hr at 12/03/17 0600  . magnesium sulfate 1 - 4 g bolus IVPB 4 g (12/04/17 1000)  . phenylephrine (NEO-SYNEPHRINE) Adult infusion 300 mcg/min (12/04/17 1015)  . piperacillin-tazobactam (ZOSYN)  IV Stopped (12/04/17 1004)  . potassium PHOSPHATE IVPB (mmol) 30  mmol (12/04/17 1035)  . vancomycin 1,500 mg (12/04/17 0923)  . [START ON 12/05/2017] vancomycin       LOS: 5 days    Time spent: More than 50% of that time was spent in counseling and/or coordination of care.      Marene Lenz, MD [facility] Pager [phone]  If 7PM-7AM, please contact night-coverage [email or web address] Password [facility] 12/04/2017, 10:56 AM

## 2017-12-04 NOTE — Progress Notes (Signed)
PHARMACY - PHYSICIAN COMMUNICATION CRITICAL VALUE ALERT - BLOOD CULTURE IDENTIFICATION (BCID)  Carol Powers is an 77 y.o. female who presented to [facility] on [facility]/2017 with a chief complaint of weakness and AMS.  Assessment:  Patient was initially on Ceftriaxone and Doxycyline for CAP and UTI.  Abx changed to Zosyn dosing for persistent fever and elevated WBC.  Now 1/2 [place] with [place] (BCID showed staph species)  Name of physician (or Provider) Contacted: X. Blount  Current antibiotics: zosyn  Changes to prescribed antibiotics recommended:  Continue zosyn.  Suspects CoNS is a contaminant.  Results for orders placed or performed during the hospital encounter of 11/26/2017  Blood Culture ID Panel (Reflexed) (Collected: 12/03/2017 12:04 AM)  Result Value Ref Range   Enterococcus species NOT DETECTED NOT DETECTED   Listeria monocytogenes NOT DETECTED NOT DETECTED   Staphylococcus species DETECTED (A) NOT DETECTED   Staphylococcus aureus NOT DETECTED NOT DETECTED   Methicillin resistance NOT DETECTED NOT DETECTED   Streptococcus species NOT DETECTED NOT DETECTED   Streptococcus agalactiae NOT DETECTED NOT DETECTED   Streptococcus pneumoniae NOT DETECTED NOT DETECTED   Streptococcus pyogenes NOT DETECTED NOT DETECTED   Acinetobacter baumannii NOT DETECTED NOT DETECTED   Enterobacteriaceae species NOT DETECTED NOT DETECTED   Enterobacter cloacae complex NOT DETECTED NOT DETECTED   Escherichia coli NOT DETECTED NOT DETECTED   Klebsiella oxytoca NOT DETECTED NOT DETECTED   Klebsiella pneumoniae NOT DETECTED NOT DETECTED   Proteus species NOT DETECTED NOT DETECTED   Serratia marcescens NOT DETECTED NOT DETECTED   Haemophilus influenzae NOT DETECTED NOT DETECTED   Neisseria meningitidis NOT DETECTED NOT DETECTED   Pseudomonas aeruginosa NOT DETECTED NOT DETECTED   Candida albicans NOT DETECTED NOT DETECTED   Candida glabrata NOT DETECTED NOT DETECTED   Candida krusei NOT DETECTED NOT  DETECTED   Candida parapsilosis NOT DETECTED NOT DETECTED   Candida tropicalis NOT DETECTED NOT DETECTED    Jaber Dunlow P 12/04/2017  4:20 AM

## 2017-12-04 NOTE — Progress Notes (Signed)
PULMONARY / CRITICAL CARE MEDICINE   Name: Carol Powers MRN: [id number] DOB: 1941-09-11    ADMISSION DATE:  11/24/2017 CONSULTATION DATE:  12/24/2017  REFERRING MD:  Dr. Tyrone Nine / EDP   CHIEF COMPLAINT:  Hypotension   HISTORY OF PRESENT ILLNESS:   77 y/o F, former remote smoker (5 years, quit 50 years ago) who presented to [facility] ER on 3/6 after developing altered mental status at home with RUL post obstructive pneumonia   On presentation, she was found to have soft blood pressures (94/41).  She was given 2.5L of NS. Initial labs - Na 125, K 5.1, Cl 94, glucose 104, BUN 37, Sr Cr 2.2, lactic acid 2.25,  WBC 16.4, hgb 11 and platelets 461.  CXR demonstrates known right suprahilar mass with slight worsening density in the RUL, LLL.  BP improved significantly with fluid administration.    PCCM called for evaluation.    STUDIES:  3/4  CT Chest >> 6.1 cm posterior RUL mass concerning for primary bronchogenic neoplasm, mass extends to the right perihilar region and abuts the right major fissure.  Mediastinal & right hildar nodal metastases.  Bilateral adrenal masses measuring up to 5.2 cm on the R worrisome for metastatic disease.  Scattered hepatic lesions measuring up to 47mm, indeterminate but worrisome for metastases.   Renal US 3/6 >> normal renal US CT Head 3/6 >> mild chronic ischemic white matter disease. No acute abnormality.   CULTURES: BCx2 3/6 >> ng BC 3/10 MRSE 2/2  UA 3/6 >> concern for UTI  UC 3/6 >> greater 100k E-Coli >> pan sensitive   ANTIBIOTICS: Vanco 3/6 x1 Zosyn 3/6 x1 Rocephin (UTI) 3/7 >> 3/10 Azithro (L infiltrate) 3/7 >> 3/10 Zosyn 3/10 >> vanc 3/10 >>  SIGNIFICANT EVENTS: 3/06  Admit, initially improved with IVF > decompensated late PM / intubated  3/07  Levophed 22 mcg, Vasopressin. CVP 10 3/8 Off pressors, extubated.  LINES/TUBES: ETT 3/6 >> 3/8 Rt femoral TLC 3/7 >>3/9 A line 3/7 >>3/9  SUBJECTIVE:    Worsened overnight with HFnC, then altered  mental status this am requiring re-intubation by anesthesia  VITAL SIGNS: BP (!) 119/47   Pulse 99   Temp 99.7 F (37.6 C)   Resp 16   Ht 5\' 1"  (1.549 m)   Wt 157 lb 3 oz (71.3 kg)   SpO2 (!) 80%   BMI 29.70 kg/m   HEMODYNAMICS:    VENTILATOR SETTINGS: Vent Mode: PRVC FiO2 (%):  [100 %] 100 % Set Rate:  [14 bmp] 14 bmp Vt Set:  [400 mL] 400 mL PEEP:  [8 cmH20] 8 cmH20 Plateau Pressure:  [10 cmH20] 10 cmH20  INTAKE / OUTPUT: I/O last 3 completed shifts: In: 1274 [P.O.:540; I.V.:340; IV Piggyback:394] Out: 9371 [IRCVE:9381]  PHYSICAL EXAMINATION: Gen:    Just intubated , in acute distress, elderly frail. HEENT:  EOMI, sclera anicteric Neck:     No masses; no thyromegaly Lungs:    Decreased on right  CV:         Regular rate and rhythm; no murmurs, freq PVCs Abd:      + bowel sounds; soft, non-tender; no palpable masses, no distension Ext:    No edema; adequate peripheral perfusion Skin:      Warm and dry; no rash Neuro: sedated    LABS:  BMET Recent Labs  Lab 12/02/17 0531 12/03/17 0434 12/04/17 0659  NA 139 143 133*  K 2.5* 3.0* 2.3*  CL 107 111 107  CO2 22  21* 21*  BUN 24* 25* 22*  CREATININE 0.88 0.83 1.04*  GLUCOSE 107* 83 335*    Electrolytes Recent Labs  Lab 12/02/17 0531 12/03/17 0434 12/04/17 0659  CALCIUM 8.7* 9.0 7.0*  MG 1.8 1.7 1.3*  PHOS 2.3* 1.2* 1.9*    CBC Recent Labs  Lab 12/02/17 0531 12/03/17 0829 12/04/17 0659  WBC 16.9* 16.6* 19.2*  HGB 9.3* 9.1* 7.0*  HCT 27.4* 27.4* 21.2*  PLT 346 177 212    Coag's No results for input(s): APTT, INR in the last 168 hours.  Sepsis Markers Recent Labs  Lab 12/24/2017 2007 12/04/2017 2144 12/04/17 0659  LATICACIDVEN 2.1* 3.3* 2.3*  PROCALCITON  --   --  8.62    ABG Recent Labs  Lab 11/30/17 0240 11/30/17 0616 12/04/17 0528  PHART 7.255* 7.284* 7.275*  PCO2ART 28.1* 26.2* 51.7*  PO2ART 129* 124* 59.0*    Liver Enzymes Recent Labs  Lab 11/30/2017 1133  AST 38  ALT  17  ALKPHOS 98  BILITOT 1.1  ALBUMIN 3.1*    Cardiac Enzymes Recent Labs  Lab 12/04/17 0659  TROPONINI 0.04*    Glucose Recent Labs  Lab 12/04/17 0431 12/04/17 0520 12/04/17 0550 12/04/17 0610 12/04/17 0710 12/04/17 0828  GLUCAP 31* 58* 74 129* 190* 67    Imaging Dg Chest 1 View  Result Date: 12/04/2017 CLINICAL DATA:  78 year old female with endotracheal tube placement. Subsequent encounter. EXAM: CHEST 1 VIEW COMPARISON:  12/04/2017 6:07 a.m. chest x-ray.  11/27/2017 chest CT. FINDINGS: Endotracheal tube tip 2.5 cm above the carina. Nasogastric tube courses below the diaphragm. Tip is not included on the present exam. Right hilar/upper lobe mass with obstruction and atelectasis right upper lobe. Bilateral pleural effusions with basilar consolidation suspicious for atelectasis with infiltrate less likely consideration. Stable mild central pulmonary vascular prominence. IMPRESSION: Endotracheal tube tip 2.5 cm above the carina. Right hilar/upper lobe mass with obstruction and atelectasis of the right upper lobe. Bilateral pleural effusions with basilar consolidation suspicious for atelectasis with infiltrate less likely consideration. Electronically Signed   By: Genia Del M.D.   On: 12/04/2017 08:31   Dg Abd 1 View  Result Date: 12/04/2017 CLINICAL DATA:  77 year old female with gastric tube placement. Subsequent encounter. EXAM: ABDOMEN - 1 VIEW COMPARISON:  12/06/2017. FINDINGS: Nasogastric tube tip gastric body level. Side hole fundus-body junction. Nonspecific calcifications mid aspect left abdomen just left of midline possibly pancreatic in origin. Post cholecystectomy. No bowel obstruction. The possibility of free intraperitoneal air cannot be assessed on a supine view. IMPRESSION: Nasogastric tube tip gastric body level. Side hole fundus-body junction. No evidence of bowel obstruction. Nonspecific calcifications mid aspect left abdomen just left of midline possibly  pancreatic in origin. Electronically Signed   By: Genia Del M.D.   On: 12/04/2017 08:34   Portable Chest X-ray  Result Date: 12/04/2017 CLINICAL DATA:  77 year old female with right perihilar lung mass, shortness of breath, intubated and enteric tube placed. Central line placement. EXAM: PORTABLE CHEST 1 VIEW COMPARISON:  0803 hours today and earlier. FINDINGS: Portable AP semi upright view at 0846 hours. Right IJ approach central line placed. Catheter tip projects about 1 centimeter below the expected cavoatrial junction level. No pneumothorax. Continued right upper lobe collapse and/or consolidation now obscuring the right hilar and paratracheal masslike opacity seen in February. Stable endotracheal tube and visible enteric tube. Small bilateral pleural effusions are suspected and stable. Other mediastinal contours are stable. Stable cholecystectomy clips. Paucity bowel gas in the upper abdomen. IMPRESSION:  1. Right IJ central line placed, the tip projects about 1 cm below the cavoatrial junction level. No pneumothorax. 2. Right hilar lung mass with right upper collapse and/or consolidation. 3. Small bilateral pleural effusions. No new cardiopulmonary abnormality. Electronically Signed   By: Genevie Ann M.D.   On: 12/04/2017 09:12   Dg Chest Port 1 View  Result Date: 12/04/2017 CLINICAL DATA:  Acute onset of shortness of breath. EXAM: PORTABLE CHEST 1 VIEW COMPARISON:  Chest radiograph performed 12/03/2017 FINDINGS: New right upper lobe and worsening left basilar airspace opacities are compatible with worsening pneumonia. A small left pleural effusion is suspected. No pneumothorax is seen. The cardiomediastinal silhouette is borderline normal in size. No acute osseous abnormalities are identified. IMPRESSION: Worsening bilateral pneumonia noted. Suspect small left pleural effusion. Followup PA and lateral chest X-ray is recommended in 3-4 weeks following trial of antibiotic therapy to ensure resolution and  exclude underlying malignancy. Electronically Signed   By: Garald Balding M.D.   On: 12/04/2017 06:24     DISCUSSION: 77 y/o F, former remote smoker (50 years ago), admitted with hypotension and altered mental status. Treated for e coli  UTI.   Recent discovery of lung mass with adrenal/ liver mets. Reintubated 3/11 for acute hyerpcarbic resp failure due to ? worsenign RUL collapse/post obs pneumonia  ASSESSMENT / PLAN:  PULMONARY A: Acute hypercarbic resp failure, reintubated 3/11 Right Lung Mass - new dx as of 3/4 RUL atelectasis/ Post-Obstructive PNA  Former Remote Smoker  P:   PRVC / vent settings reviewed & adjusted Bedside  Bronchoscopy today If bronch neg, IR  consult  to see if the liver, adrenal lesions can be approached first.   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   - Inter-disciplinary family meet or Palliative Care meeting due by:  3/12  The  patient is critically ill with multiple organ systems failure and requires high complexity decision making for assessment and support, frequent evaluation and titration of therapies, application of advanced monitoring technologies and extensive interpretation of multiple databases. Critical Care Time devoted to patient care services described in this note independent of APP/resident  time is 45 minutes.    Kara Mead MD. Shade Flood. [facility] Pager [phone] If no response call [phone]    12/04/2017, 9:26 AM

## 2017-12-04 NOTE — Anesthesia Procedure Notes (Signed)
Procedure Name: Intubation Date/Time: 12/04/2017 7:35 AM Performed by: Garrel Ridgel, CRNA Pre-anesthesia Checklist: Patient identified, Emergency Drugs available, Suction available, Patient being monitored and Timeout performed Patient Re-evaluated:Patient Re-evaluated prior to induction Oxygen Delivery Method: Ambu bag Preoxygenation: Pre-oxygenation with 100% oxygen Induction Type: IV induction, Rapid sequence and Cricoid Pressure applied Ventilation: Mask ventilation without difficulty Laryngoscope Size: Mac and 3 Grade View: Grade II Tube type: Subglottic suction tube Tube size: 7.0 mm Number of attempts: 1 Airway Equipment and Method: Stylet Placement Confirmation: ETT inserted through vocal cords under direct vision,  positive ETCO2,  CO2 detector and breath sounds checked- equal and bilateral Secured at: 22 cm Tube secured with: Tape Dental Injury: Teeth and Oropharynx as per pre-operative assessment

## 2017-12-04 NOTE — Procedures (Signed)
Arterial Catheter Insertion Procedure Note ITHZEL FEDORCHAK [id number] March 20, 1941  Procedure: Insertion of Arterial Catheter  Indications: low blood pressure / blood draw analysis  Procedure Details Consent: Risks of procedure as well as the alternatives and risks of each were explained to the (patient/caregiver).  Consent for procedure obtained. Time Out: Verified patient identification, verified procedure, site/side was marked, verified correct patient position, special equipment/implants available, medications/allergies/relevent history reviewed, required imaging and test results available.  Performed  Maximum sterile technique was used including antiseptics, cap, gloves, gown, hand hygiene, mask and sheet. Skin prep: Chlorhexidine; local anesthetic administered 20 gauge catheter was inserted into right radial artery using the Seldinger technique.  Evaluation Blood flow good; BP tracing good. Complications: No apparent complications.   Martha Clan 12/04/2017

## 2017-12-04 NOTE — Progress Notes (Signed)
Pharmacy Antibiotic Note  Carol Powers is a 77 y.o. female admitted on 12/08/2017 with weakness, AMS, and recent (11/27/17) CT with  RUL mass and adrenal masses concerning for metastatic lung cancer.  She was initially on Ceftriaxone and Doxycyline for CAP and UTI.  Pharmacy consulted for Zosyn dosing for sepsis pm 3/10 and consulted for vancomycin on 3/11, postobstructive pna, ongoing leukocytosis, and fever despite current abx.  Today, 12/04/2017: Tm 100.9 (improved from > 105 initially, but range is 99.1-100.9) WBC inc to 16.6 (no steroids) SCr 0.83 with CrCl ~ 52 ml/min     Plan:  Zosyn 3.375g IV Q8H infused over 4hrs.  Vancomycin 1500 mg IV loading dose followed by vancomycin 750 mg IV q24 for est AUC 509 using SCr 0.85  Follow up renal fxn, culture results, and clinical course.  Vancomycin levels as needed  Height: 5\' 1"  (154.9 cm) Weight: 157 lb 3 oz (71.3 kg) IBW/kg (Calculated) : 47.8  Temp (24hrs), Avg:100.8 F (38.2 C), Min:100.2 F (37.9 C), Max:101.8 F (38.8 C)  Recent Labs  Lab 12/13/2017 1133 11/28/2017 1238 11/28/2017 1429 12/04/2017 2007 11/28/2017 2144 11/30/17 0500 12/01/17 0500 12/02/17 0531 12/03/17 0434 12/03/17 0829  WBC 16.4*  --   --   --   --  21.9* 12.7* 16.9*  --  16.6*  CREATININE 2.22* 2.20*  --   --   --  1.36* 1.11* 0.88 0.83  --   LATICACIDVEN  --  2.25* 2.87* 2.1* 3.3*  --   --   --   --   --     Estimated Creatinine Clearance: 52.1 mL/min (by C-G formula based on SCr of 0.83 mg/dL).    Allergies  Allergen Reactions  . Beta Adrenergic Blockers Other (See Comments)    Nightmares  . Codeine Nausea And Vomiting    severe  . Dexilant [Dexlansoprazole]     Rapid heart rate   . Erythromycin     Messed liver up  . Septra [Sulfamethoxazole-Trimethoprim]     Blisters     Antimicrobials this admission: 3/6 vanc/zosyn x1 3/6 CTX>> 3/10 3/6 Doxy >> 3/10 3/10 Zosyn >>  3/11 vanc >> Dose adjustments this admission:   Microbiology  results:  3/6 BCx x2: ngtd 3/6 UCx: > 100k Ecoli (pan-sens) 3/6 MRSA PCR: neg 3/6 ur strep pneu: neg 3/6 legionella: neg 3/6 UA: many bacteria, large leuk 3/7 Resp PCR panel: none detected 3/10 BCx: BCID MS CNS  Thank you for allowing pharmacy to be a part of this patient's care.  Eudelia Bunch, Pharm.D. [phone] 12/04/2017 6:57 AM

## 2017-12-04 NOTE — Progress Notes (Signed)
[facility] Progress Note Patient Name: Carol Powers DOB: 12-26-1940 MRN: [id number]   Date of Service  12/04/2017  HPI/Events of Note  Agitation - Presently on Fentanyl 50 mcg IV Q 2 hours.   eICU Interventions  Will order: 1. Increase Fentanyl to 50-100 mcg IV Q 1 hour PRN.     Intervention Category Minor Interventions: Agitation / anxiety - evaluation and management  Lysle Dingwall 12/04/2017, 8:52 PM

## 2017-12-04 NOTE — Addendum Note (Signed)
Addendum  created 12/04/17 0844 by Albertha Ghee, MD   [place] filed

## 2017-12-04 NOTE — Progress Notes (Signed)
[facility] Progress Note Patient Name: Carol Powers DOB: [birth year] MRN: [id number]   Date of Service  12/04/2017  HPI/Events of Note  77 yo female admitted on 12/06/2017 with Dx of postobstructive pneumonia and cystitis. Acutely became hypoglycemic, hypotensive. Currently being treated with Zosyn. Has received 2 liter of 0.9 NaCl and Phenylephrine IV infusion being started and titrated. Low blood glucose also treated with D50 and D5W IV infusion.    eICU Interventions  Will order: 1. Cycle Troponin. 2. 12 Lead EKG STAT. --> NSR with nonspecific T wave abnormality 3. Phenylephrine IV infusion. Titrate to MAP > 65.  4. Titrate O2 to keep sat >= 92%. 5. Lactic acid level now.      Intervention Category Evaluation Type: New Patient Evaluation  Lysle Dingwall 12/04/2017, 6:02 AM

## 2017-12-05 ENCOUNTER — Inpatient Hospital Stay (HOSPITAL_COMMUNITY): Payer: Medicare Other

## 2017-12-05 LAB — BASIC METABOLIC PANEL
ANION GAP: 8 (ref 5–15)
Anion gap: 5 (ref 5–15)
BUN: 22 mg/dL — ABNORMAL HIGH (ref 6–20)
BUN: 30 mg/dL — ABNORMAL HIGH (ref 6–20)
CALCIUM: 7 mg/dL — AB (ref 8.9–10.3)
CALCIUM: 7.9 mg/dL — AB (ref 8.9–10.3)
CO2: 21 mmol/L — AB (ref 22–32)
CO2: 20 mmol/L — ABNORMAL LOW (ref 22–32)
CREATININE: 1.04 mg/dL — AB (ref 0.44–1.00)
CREATININE: 1.43 mg/dL — AB (ref 0.44–1.00)
Chloride: 107 mmol/L (ref 101–111)
Chloride: 107 mmol/L (ref 101–111)
GFR calc Af Amer: 40 mL/min — ABNORMAL LOW (ref >60)
GFR calc non Af Amer: 51 mL/min — ABNORMAL LOW (ref >60)
GFR, EST AFRICAN AMERICAN: 59 mL/min — AB (ref >60)
GFR, EST NON AFRICAN AMERICAN: 35 mL/min — AB (ref >60)
Glucose, Bld: 335 mg/dL — ABNORMAL HIGH (ref 65–99)
Glucose, Bld: 92 mg/dL (ref 65–99)
Potassium: 2.3 mmol/L — CL (ref 3.5–5.1)
Potassium: 3.7 mmol/L (ref 3.5–5.1)
SODIUM: 133 mmol/L — AB (ref 135–145)
Sodium: 135 mmol/L (ref 135–145)

## 2017-12-05 LAB — BPAM RBC
Blood Product Expiration Date: 201903142359
ISSUE DATE / TIME: 201903111404
Unit Type and Rh: 6200

## 2017-12-05 LAB — TYPE AND SCREEN
ABO/RH(D): A POS
Antibody Screen: NEGATIVE
UNIT DIVISION: 0

## 2017-12-05 LAB — GLUCOSE, CAPILLARY
GLUCOSE-CAPILLARY: 79 mg/dL (ref 65–99)
GLUCOSE-CAPILLARY: 118 mg/dL — AB (ref 65–99)
GLUCOSE-CAPILLARY: 91 mg/dL (ref 65–99)
Glucose-Capillary: 73 mg/dL (ref 65–99)
Glucose-Capillary: 109 mg/dL — ABNORMAL HIGH (ref 65–99)
Glucose-Capillary: 109 mg/dL — ABNORMAL HIGH (ref 65–99)

## 2017-12-05 LAB — CBC
HCT: 27.1 % — ABNORMAL LOW (ref 36.0–46.0)
Hemoglobin: 9 g/dL — ABNORMAL LOW (ref 12.0–15.0)
MCH: 28 pg (ref 26.0–34.0)
MCHC: 33.2 g/dL (ref 30.0–36.0)
MCV: 84.2 fL (ref 78.0–100.0)
PLATELETS: 232 10*3/uL (ref 150–400)
RBC: 3.22 MIL/uL — ABNORMAL LOW (ref 3.87–5.11)
RDW: 16.8 % — AB (ref 11.5–15.5)
WBC: 26.1 10*3/uL — AB (ref 4.0–10.5)

## 2017-12-05 LAB — CULTURE, BLOOD (ROUTINE X 2): Special Requests: ADEQUATE

## 2017-12-05 LAB — HEPATIC FUNCTION PANEL
ALBUMIN: 1.5 g/dL — AB (ref 3.5–5.0)
ALT: 21 U/L (ref 14–54)
AST: 68 U/L — AB (ref 15–41)
Alkaline Phosphatase: 84 U/L (ref 38–126)
BILIRUBIN DIRECT: 0.1 mg/dL (ref 0.1–0.5)
Indirect Bilirubin: 0.5 mg/dL (ref 0.3–0.9)
Total Bilirubin: 0.6 mg/dL (ref 0.3–1.2)
Total Protein: 4.4 g/dL — ABNORMAL LOW (ref 6.5–8.1)

## 2017-12-05 LAB — ACID FAST SMEAR (AFB, MYCOBACTERIA): Acid Fast Smear: NEGATIVE

## 2017-12-05 LAB — PHOSPHORUS: PHOSPHORUS: 2.9 mg/dL (ref 2.5–4.6)

## 2017-12-05 LAB — ACID FAST SMEAR (AFB)

## 2017-12-05 LAB — MAGNESIUM: MAGNESIUM: 2 mg/dL (ref 1.7–2.4)

## 2017-12-05 MED ORDER — VITAMINS A & D EX OINT
TOPICAL_OINTMENT | CUTANEOUS | Status: AC
  Administered 2017-12-05: 1
  Filled 2017-12-05: qty 5

## 2017-12-05 MED ORDER — SODIUM CHLORIDE 0.9% FLUSH
10.0000 mL | INTRAVENOUS | Status: DC | PRN
  Administered 2017-12-08: 10 mL
  Filled 2017-12-05: qty 40

## 2017-12-05 MED ORDER — SODIUM CHLORIDE 0.9% FLUSH
10.0000 mL | Freq: Two times a day (BID) | INTRAVENOUS | Status: DC
  Administered 2017-12-05 – 2017-12-09 (×7): 10 mL

## 2017-12-05 MED ORDER — DEXTROSE 50 % IV SOLN
INTRAVENOUS | Status: AC
  Administered 2017-12-05: 50 mL
  Filled 2017-12-05: qty 50

## 2017-12-05 MED ORDER — VANCOMYCIN HCL IN DEXTROSE 750-5 MG/150ML-% IV SOLN
750.0000 mg | INTRAVENOUS | Status: DC
  Administered 2017-12-06: 750 mg via INTRAVENOUS
  Filled 2017-12-05: qty 150

## 2017-12-05 MED ORDER — VITAL AF 1.2 CAL PO LIQD
1000.0000 mL | ORAL | Status: DC
  Administered 2017-12-05 – 2017-12-07 (×4): 1000 mL
  Filled 2017-12-05 (×6): qty 1000

## 2017-12-05 MED ORDER — CHLORHEXIDINE GLUCONATE CLOTH 2 % EX PADS
6.0000 | MEDICATED_PAD | Freq: Every day | CUTANEOUS | Status: DC
  Administered 2017-12-06 – 2017-12-08 (×2): 6 via TOPICAL

## 2017-12-05 NOTE — Progress Notes (Signed)
[facility] Progress Note Patient Name: Carol Powers DOB: 1941/08/06 MRN: [id number]   Date of Service  12/05/2017  HPI/Events of Note  Hypoglycemia - Blood glucose = 63.   eICU Interventions  Will increase D5W IV infusion to 75 mL/hour.      Intervention Category Major Interventions: Other:  Lysle Dingwall 12/05/2017, 12:19 AM

## 2017-12-05 NOTE — Progress Notes (Signed)
Pharmacy Antibiotic Note  Carol Powers is a 77 y.o. female admitted on 12/07/2017 with weakness, AMS, and recent (11/27/17) CT with  RUL mass and adrenal masses concerning for metastatic lung cancer.  She was initially on Ceftriaxone and Doxycyline for CAP and UTI.  Pharmacy consulted for Zosyn dosing for sepsis pm 3/10 and consulted for vancomycin on 3/11, postobstructive pna, ongoing leukocytosis, and fever despite current abx.  Today, 12/05/2017: Day #3 Zosyn Day #2 Vancomycin SCr rising 1.04 >> 1.43 Tm 102  WBC inc to 26.1 (no steroids)  Plan:  Continue Zosyn 3.375g IV Q8H infused over 4hrs.  Adjust Vancomycin to 757m IV q48h with next dose due 3/13 at 10:00 for est AUC 398 using SCr 1.43. Received loading dose of 1500 mg on 3/11. for est AUC 509 using SCr 0.85  Follow up renal fxn, culture results, and clinical course.  Vancomycin levels as needed  Height: _0  (154.9 cm) Weight: 157 lb 3 oz (71.3 kg) IBW/kg (Calculated) : 47.8  Temp (24hrs), Avg:98.4 F (36.9 C), Min:96.8 F (36 C), Max:102.2 F (39 C)  Recent Labs  Lab 12/23/2017 1429 11/30/2017 2007 12/18/2017 2144  12/01/17 0500 12/02/17 0531 12/03/17 0434 12/03/17 0829 12/04/17 0659 12/04/17 1110 12/05/17 0600  WBC  --   --   --    < > 12.7* 16.9*  --  16.6* 19.2*  --  26.1*  CREATININE  --   --   --    < > 1.11* 0.88 0.83  --  1.04*  --  1.43*  LATICACIDVEN 2.87* 2.1* 3.3*  --   --   --   --   --  2.3* 1.8  --    < > = values in this interval not displayed.    Estimated Creatinine Clearance: 30.2 mL/min (A) (by C-G formula based on SCr of 1.43 mg/dL (H)).    Allergies  Allergen Reactions  . Beta Adrenergic Blockers Other (See Comments)    Nightmares  . Codeine Nausea And Vomiting    severe  . Dexilant [Dexlansoprazole]     Rapid heart rate   . Erythromycin     Messed liver up  . Septra [Sulfamethoxazole-Trimethoprim]     Blisters     Antimicrobials this admission:  3/6 vanc/zosyn x1 3/6 CTX>>  3/10 3/6 Doxy >> 3/10 3/10 Zosyn >>  3/11 vanc>>  Dose adjustments this admission:  3/12 adjust vanc from [id number] to q48h for SCr rise to 1.43, AUC 398 (did not get a q24h dose)  Microbiology results:  3/6 BCx x2: NGF 3/6 UCx: > 100k Ecoli (pan-sens) 3/6 MRSA PCR: neg 3/6 ur strep pneu: neg 3/6 legionella: neg 3/6 UA: many bacteria, large leuk 3/7 Resp PCR panel: none detected 3/10 BCx x2: CoNS (BCID= staph species) - suspects contaminant 3/11 BAL: no organisms see, cx pending 3/11 BAL (AFB): 3/11 BAL (fungal):  Thank you for allowing pharmacy to be a part of this patient's care.  ErPeggyann JubaPharmD, BCPS Pager: [phone]/08/2018 8:00 AM

## 2017-12-05 NOTE — Progress Notes (Signed)
Nutrition Follow-up   INTERVENTION:   D/C Vital HP & Prostat orders  Initiate Vital AF 1.2 @ 35 ml/hr, advance by 10 ml every 4 hours to goal rate of 55 ml/hr. This will provide 1584 kcal (94% of needs), 99g protein and 1070 ml H2O.  NUTRITION DIAGNOSIS:   Inadequate oral intake related to inability to eat as evidenced by NPO status.  Ongoing.  GOAL:   Patient will meet greater than or equal to 90% of their needs  Not meeting.  MONITOR:   TF tolerance, Vent status, Weight trends, Labs  REASON FOR ASSESSMENT:   Ventilator, Consult Enteral/tube feeding initiation and management  ASSESSMENT:   77 year old former smoker with new diagnosis of right upper lobe mass concerning for cancer. Admitted with altered mental status, AKI, hypotension, likely sepsis from UTI, elevated lactic acid.  Patient extubated on 3/8. Re-intubated 3/11 d/t desaturation. Tube feeds to resume via OGT. Currently receiving VHP @ 20 ml/hr, providing 480 kcal and 42g protein. Will switch formula to Vital AF 1.2 to better meet needs.  Patient is currently intubated on ventilator support MV: 8.9 L/min Temp (24hrs), Avg:98.8 F (37.1 C), Min:96.8 F (36 C), Max:102.2 F (39 C)  Medications: D5 solution at 75 ml/hr -provides 306 kcal,  Labs reviewed: Mg/Phos/K WNL  Diet Order:  Diet NPO time specified  EDUCATION NEEDS:   No education needs have been identified at this time  Skin:  Skin Assessment: Reviewed RN Assessment  Last BM:  PTA/unknown  Height:   Ht Readings from Last 1 Encounters:  12/13/2017 5\' 1"  (1.549 m)    Weight:   Wt Readings from Last 1 Encounters:  12/03/17 157 lb 3 oz (71.3 kg)    Ideal Body Weight:  47.73 kg  BMI:  Body mass index is 29.7 kg/m.  Estimated Nutritional Needs:   Kcal:  3825  Protein:  100-110g  Fluid:  2L/day  Clayton Bibles, MS, RD, LDN [place] [facility] Pager: [phone] After Hours Pager: [phone]

## 2017-12-05 NOTE — Progress Notes (Signed)
PULMONARY / CRITICAL CARE MEDICINE   Name: Carol Powers MRN: [id number] DOB: Feb 05, 1941    ADMISSION DATE:  12/22/2017 CONSULTATION DATE:  12/17/2017  REFERRING MD:  Dr. 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  Mediastinal & right hildar nodal metastases.  Bilateral adrenal masses measuring up to 5.2 cm on the R worrisome for metastatic disease.  Scattered hepatic lesions measuring up to 21mm, indeterminate but worrisome for metastases.   Renal US 3/6 >> normal renal US CT Head 3/6 >> mild chronic ischemic white matter disease. No acute abnormality.   CULTURES: BCx2 3/6 >> ng BC 3/10 MRSE 2/2  UA 3/6 >> concern for UTI  UC 3/6 >> greater 100k E-Coli >> pan sensitive   ANTIBIOTICS: Vanco 3/6 x1 Zosyn 3/6 x1 Rocephin (UTI) 3/7 >> 3/10 Azithro (L infiltrate) 3/7 >> 3/10 Zosyn 3/10 >> vanc 3/10 >>  SIGNIFICANT EVENTS: 3/06  Admit, initially improved with IVF > decompensated late PM / intubated  3/07  Levophed 22 mcg, Vasopressin. CVP 10 3/8 Off pressors, extubated. 3/11 Worsened overnight with HFnC, then altered mental status this am requiring re-intubation by anesthesia  LINES/TUBES: ETT 3/6 >> 3/8, 3/11  >> Rt femoral TLC 3/7 >>3/9 A line 3/7 >>3/9 RIJ 3/11 >>  SUBJECTIVE:    Remains febrile, critically ill, on low-dose levo fed Off sedation but remains lethargic Poor urine output  VITAL SIGNS: BP (!) 129/46   Pulse  87   Temp 100 F (37.8 C)   Resp (!) 25   Ht 5\' 1"  (1.549 m)   Wt 157 lb 3 oz (71.3 kg)   SpO2 98%   BMI 29.70 kg/m   HEMODYNAMICS:    VENTILATOR SETTINGS: Vent Mode: PRVC FiO2 (%):  [50 %-60 %] 50 % Set Rate:  [20 bmp] 20 bmp Vt Set:  [400 mL] 400 mL PEEP:  [5 cmH20] 5 cmH20 Plateau Pressure:  [15 cmH20-19 cmH20] 19 cmH20  INTAKE / OUTPUT: I/O last 3 completed shifts: In: 5104.5 [I.V.:3829.5; Blood:315; NG/GT:150; IV Piggyback:810] Out: 8889 [Urine:965; Emesis/NG output:120]  PHYSICAL EXAMINATION: Gen. Acutely ill,  well-nourished, in no distress normal affect ENT - oral ETT, no post nasal drip Neck: No JVD, no thyromegaly, no carotid bruits Lungs: no use of accessory muscles, no dullness to percussion, decreased RT without rales or rhonchi  Cardiovascular: Rhythm regular, heart sounds  normal, no murmurs or gallops, no peripheral edema Abdomen: soft and non-tender, no hepatosplenomegaly, BS normal. Musculoskeletal: No deformities, no cyanosis or clubbing Neuro: opens eyes, does not follow commands, appears tired    LABS:  BMET Recent Labs  Lab 12/03/17 0434 12/04/17 0659 12/05/17 0600  NA 143 133* 135  K 3.0* 2.3* 3.7  CL 111 107 107  CO2 21* 21* 20*  BUN 25* 22* 30*  CREATININE 0.83 1.04* 1.43*  GLUCOSE 83 335* 92    Electrolytes Recent Labs  Lab 12/03/17 0434 12/04/17 0659 12/05/17 0600  CALCIUM 9.0 7.0* 7.9*  MG 1.7 1.3* 2.0  PHOS 1.2* 1.9* 2.9    CBC Recent Labs  Lab 12/03/17 0829 12/04/17 0659 12/05/17 0600  WBC 16.6* 19.2* 26.1*  HGB 9.1* 7.0* 9.0*  HCT 27.4* 21.2* 27.1*  PLT 177 212 232    Coag's No results for input(s): APTT, INR in the last 168 hours.  Sepsis Markers Recent Labs  Lab 12/12/2017 2144 12/04/17 0659 12/04/17 1110  LATICACIDVEN 3.3* 2.3* 1.8  PROCALCITON  --  8.62  --     ABG Recent Labs  Lab 11/30/17 0616 12/04/17 0528 12/04/17 1225  PHART 7.284* 7.275* 7.294*  PCO2ART 26.2* 51.7* 43.3  PO2ART  124* 59.0* 126*    Liver Enzymes Recent Labs  Lab 12/23/2017 1133 12/05/17 0600  AST 38 68*  ALT 17 21  ALKPHOS 98 84  BILITOT 1.1 0.6  ALBUMIN 3.1* 1.5*    Cardiac Enzymes Recent Labs  Lab 12/04/17 0659 12/04/17 1110 12/04/17 1815  TROPONINI 0.04* 0.03* 0.04*    Glucose Recent Labs  Lab 12/04/17 1518 12/04/17 1956 12/04/17 2312 12/05/17 0314 12/05/17 0754 12/05/17 1204  GLUCAP 102* 78 63* 79 73 118*    Imaging Portable Chest Xray  Result Date: 12/05/2017 CLINICAL DATA:  Intubation. EXAM: PORTABLE CHEST 1 VIEW COMPARISON:  12/04/2017. FINDINGS: Endotracheal tube tip 1.1 cm above the carina. Proximal repositioning of approximately 2 cm should be considered. Right IJ line in stable position. NG tube in stable position. Heart size stable. Right hilar mass with persistent atelectasis right upper lung again noted. Small bilateral pleural effusions noted. No pneumothorax. IMPRESSION: 1. Endotracheal tube tip 1.1 cm above the carina. Proximal repositioning of approximately 2 cm should be considered. Right IJ line and NG tube in stable position. 2. Right hilar mass with atelectasis of the right upper lung again noted. Bilateral pleural effusions again noted. Electronically Signed   By: Marcello Moores  Register   On: 12/05/2017 09:04     DISCUSSION: 77 y/o F, former remote smoker (50 years ago), admitted with hypotension and altered mental status. Treated for e coli  UTI.   Recent discovery of lung mass with adrenal/ liver mets. Reintubated 3/11 for acute hyerpcarbic resp failure due to ? worsenign RUL collapse/post obs pneumonia  ASSESSMENT / PLAN:  PULMONARY A: Acute hypercarbic resp failure, reintubated 3/11 Right Lung Mass - new dx as of 3/4 RUL atelectasis/ Post-Obstructive PNA  Former Remote Smoker  P:   PRVC / vent settings reviewed & adjusted Await bronch results If bronch neg, IR  consult  to see if the liver, adrenal lesions can be biopsied  CARDIOVASCULAR A:   Septic Shock - in setting of E-Coli UTI, LLL infiltrate, additionally hypovolemia (HCTZ), narcotics, poor PO intake contributing  Hx HTN  P:  Ct levo gtt -aim for SBP 100 or above Hold benazepril, triamterene-HCTZ, ASA Hold norvasc    RENAL A:   AKI - suspect sepsis, hypovolemia, NSAIDS, hypotension, ACE-I Metabolic acidosis Hypokalemia Hypomag/ hypophos P:   Replete lytes aggressively  GASTROINTESTINAL A:   Protein calorie malnutrition -moderate P:   Npo Ct TFs & titrate up to goal Colace for consitpation  HEMATOLOGIC  A:   Anemia  S/p 1 U PRBC 3/11 P:  Trend CBC  Heparin for DVT prophylaxis   ONCOLOGIC A: 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   - Inter-disciplinary family meet or Palliative Care meeting due by:  3/12  The patient is critically ill with multiple organ systems failure and requires high complexity decision making for assessment and support, frequent evaluation and titration of therapies, application of advanced monitoring technologies and extensive interpretation of multiple databases. Critical Care Time devoted to patient care services described in this note independent of APP/resident  time is 35 minutes.    Kara Mead MD. Shade Flood. [facility] Pager [phone] If no response call [phone]    12/05/2017, 1:12 PM

## 2017-12-06 ENCOUNTER — Ambulatory Visit
Admit: 2017-12-06 | Discharge: 2017-12-06 | Disposition: A | Discharge status: Home / Self Care | Payer: Medicare Other | Attending: Radiation Oncology | Admitting: Radiation Oncology

## 2017-12-06 ENCOUNTER — Inpatient Hospital Stay (HOSPITAL_COMMUNITY): Payer: Medicare Other

## 2017-12-06 DIAGNOSIS — C3411 Malignant neoplasm of upper lobe, right bronchus or lung: Secondary | ICD-10-CM

## 2017-12-06 DIAGNOSIS — H3530 Unspecified macular degeneration: Secondary | ICD-10-CM

## 2017-12-06 DIAGNOSIS — I10 Essential (primary) hypertension: Secondary | ICD-10-CM

## 2017-12-06 DIAGNOSIS — C3491 Malignant neoplasm of unspecified part of right bronchus or lung: Secondary | ICD-10-CM

## 2017-12-06 DIAGNOSIS — C3490 Malignant neoplasm of unspecified part of unspecified bronchus or lung: Secondary | ICD-10-CM

## 2017-12-06 DIAGNOSIS — M797 Fibromyalgia: Secondary | ICD-10-CM

## 2017-12-06 DIAGNOSIS — K219 Gastro-esophageal reflux disease without esophagitis: Secondary | ICD-10-CM

## 2017-12-06 DIAGNOSIS — C7970 Secondary malignant neoplasm of unspecified adrenal gland: Secondary | ICD-10-CM

## 2017-12-06 DIAGNOSIS — Z87891 Personal history of nicotine dependence: Secondary | ICD-10-CM

## 2017-12-06 DIAGNOSIS — C787 Secondary malignant neoplasm of liver and intrahepatic bile duct: Secondary | ICD-10-CM

## 2017-12-06 DIAGNOSIS — K5790 Diverticulosis of intestine, part unspecified, without perforation or abscess without bleeding: Secondary | ICD-10-CM

## 2017-12-06 DIAGNOSIS — E039 Hypothyroidism, unspecified: Secondary | ICD-10-CM

## 2017-12-06 LAB — BASIC METABOLIC PANEL
ANION GAP: 9 (ref 5–15)
BUN: 41 mg/dL — ABNORMAL HIGH (ref 6–20)
CALCIUM: 7.8 mg/dL — AB (ref 8.9–10.3)
CHLORIDE: 106 mmol/L (ref 101–111)
CO2: 18 mmol/L — ABNORMAL LOW (ref 22–32)
CREATININE: 1.98 mg/dL — AB (ref 0.44–1.00)
GFR calc Af Amer: 27 mL/min — ABNORMAL LOW (ref >60)
GFR calc non Af Amer: 23 mL/min — ABNORMAL LOW (ref >60)
Glucose, Bld: 127 mg/dL — ABNORMAL HIGH (ref 65–99)
Potassium: 3.8 mmol/L (ref 3.5–5.1)
SODIUM: 133 mmol/L — AB (ref 135–145)

## 2017-12-06 LAB — CBC WITH DIFFERENTIAL/PLATELET
BASOS ABS: 0 10*3/uL (ref 0.0–0.1)
Basophils Relative: 0 %
Eosinophils Absolute: 0.3 10*3/uL (ref 0.0–0.7)
Eosinophils Relative: 1 %
HCT: 24.2 % — ABNORMAL LOW (ref 36.0–46.0)
Hemoglobin: 8 g/dL — ABNORMAL LOW (ref 12.0–15.0)
LYMPHS ABS: 1.8 10*3/uL (ref 0.7–4.0)
LYMPHS PCT: 6 %
MCH: 28 pg (ref 26.0–34.0)
MCHC: 33.1 g/dL (ref 30.0–36.0)
MCV: 84.6 fL (ref 78.0–100.0)
MONO ABS: 1.2 10*3/uL — AB (ref 0.1–1.0)
Monocytes Relative: 4 %
NEUTROS PCT: 89 %
Neutro Abs: 26.1 10*3/uL — ABNORMAL HIGH (ref 1.7–7.7)
PLATELETS: 222 10*3/uL (ref 150–400)
RBC: 2.86 MIL/uL — AB (ref 3.87–5.11)
RDW: 17.1 % — AB (ref 11.5–15.5)
WBC: 29.4 10*3/uL — AB (ref 4.0–10.5)
nRBC: 1 /100 WBC — ABNORMAL HIGH

## 2017-12-06 LAB — GLUCOSE, CAPILLARY
GLUCOSE-CAPILLARY: 111 mg/dL — AB (ref 65–99)
GLUCOSE-CAPILLARY: 95 mg/dL (ref 65–99)
GLUCOSE-CAPILLARY: 80 mg/dL (ref 65–99)
Glucose-Capillary: 115 mg/dL — ABNORMAL HIGH (ref 65–99)
Glucose-Capillary: 129 mg/dL — ABNORMAL HIGH (ref 65–99)
Glucose-Capillary: 98 mg/dL (ref 65–99)

## 2017-12-06 LAB — BLOOD GAS, ARTERIAL
ACID-BASE DEFICIT: 9.4 mmol/L — AB (ref 0.0–2.0)
Bicarbonate: 16.5 mmol/L — ABNORMAL LOW (ref 20.0–28.0)
DRAWN BY: 270211
FIO2: 0.4
MECHVT: 400 mL
O2 SAT: 85 %
PEEP/CPAP: 8 cmH2O
PH ART: 7.262 — AB (ref 7.350–7.450)
PO2 ART: 55 mmHg — AB (ref 83.0–108.0)
Patient temperature: 98.6
RATE: 20 resp/min
pCO2 arterial: 38 mmHg (ref 32.0–48.0)

## 2017-12-06 LAB — PROCALCITONIN: PROCALCITONIN: 7.84 ng/mL

## 2017-12-06 MED ORDER — SODIUM CHLORIDE 3 % IN NEBU
4.0000 mL | INHALATION_SOLUTION | Freq: Every day | RESPIRATORY_TRACT | Status: DC
  Administered 2017-12-06: 4 mL via RESPIRATORY_TRACT
  Filled 2017-12-06 (×3): qty 4

## 2017-12-06 MED ORDER — ALBUTEROL SULFATE (2.5 MG/3ML) 0.083% IN NEBU
2.5000 mg | INHALATION_SOLUTION | RESPIRATORY_TRACT | Status: DC
  Administered 2017-12-06 – 2017-12-09 (×20): 2.5 mg via RESPIRATORY_TRACT
  Filled 2017-12-06 (×21): qty 3

## 2017-12-06 NOTE — Consult Note (Addendum)
Radiation Oncology         [phone] ________________________________  Name: Carol Powers        MRN: [id number]  Date of Service: 12/06/17 DOB: April 24, 1941  JK:DTOIZTI, Jenny Reichmann, MD  No ref. provider found     REFERRING PHYSICIAN: No ref. provider found   DIAGNOSIS: Diagnoses of Acute kidney injury ([place]), History of ETT, Pneumothorax, Acute respiratory failure with hypoxia ([place]), Acute respiratory failure ([place]), SOB (shortness of breath), Postobstructive pneumonia, Endotracheally intubated, Endotracheal tube present, and Encounter for orogastric (OG) tube placement were pertinent to this visit.   HISTORY OF PRESENT ILLNESS: Carol Powers is a 77 y.o. female seen at the request of Dr. Elsworth Soho for a newly diagnosed lung cancer. The patient presented on 12/22/2017 with altered mental status, UTI, and acute kidney injury. She was diagnosed with a RLL pneumonia, and was started on antibiotic therapy. She has had respiratory distress requiring intubation. She has undergone CT imagine on 12/08/2017 that confirmed concerns of postobstructive pneumonia with a RUL mass extending into the right perihilum measuring 6.1 x 6 x 5.8 cm resulting in narrowing of the right upper lobe bronchi abutting the major fissure on the right. There are also three other pleural based nodules in the RUL up to 4 mm in dimension. She also has bilateral adrenal nodules, 5.2 cm in the right and 2.8 cm on the left. She also has several hypodense lesions in the liver up to 11 mm in diameter. We are asked to see her to determine if there is a role for palliative radiotherapy.     PREVIOUS RADIATION THERAPY: No   PAST MEDICAL HISTORY:  Past Medical History:  Diagnosis Date  . Carpal tunnel syndrome   . Carpal tunnel syndrome   . Diverticulosis   . Fibrocystic breast disease   . Fibromyalgia   . HTN (hypertension)   . Hypothyroidism   . IBS (irritable bowel syndrome)   . Macular degeneration   . Migraine   . PONV  (postoperative nausea and vomiting)   . Reflux   . Sty    right eye last few days, no drainage       PAST SURGICAL HISTORY: Past Surgical History:  Procedure Laterality Date  . APPENDECTOMY    . arthroscopic knee surgery Bilateral yrs ago  . CHOLECYSTECTOMY  9 yrs ago  . COLONOSCOPY WITH PROPOFOL N/A 05/13/2014   Procedure: COLONOSCOPY WITH PROPOFOL;  Surgeon: Garlan Fair, MD;  Location: [facility];  Service: Endoscopy;  Laterality: N/A;  . REPLACEMENT TOTAL KNEE Right 7 yrs ago  . thryoid surgery  20 yrs ago   partial     FAMILY HISTORY:  Family History  Problem Relation Age of Onset  . Heart failure Father   . Cancer Mother      SOCIAL HISTORY:  reports that she quit smoking about 50 years ago. Her smoking use included cigarettes. She has a 0.50 pack-year smoking history. she has never used smokeless tobacco. She reports that she drinks about 1.0 oz of alcohol per week. She reports that she does not use drugs.   ALLERGIES: Beta adrenergic blockers; Codeine; Dexilant [dexlansoprazole]; Erythromycin; and Septra [sulfamethoxazole-trimethoprim]   MEDICATIONS:  Current Facility-Administered Medications  Medication Dose Route Frequency Provider Last Rate Last Dose  . 0.9 %  sodium chloride infusion   Intravenous Continuous Rigoberto Noel, MD 50 mL/hr at 12/05/17 0953    . 0.9 %  sodium chloride infusion   Intravenous Once Rigoberto Noel,  MD      . acetaminophen (TYLENOL) solution 650 mg  650 mg Per Tube Q6H PRN Anders Simmonds, MD   650 mg at 12/06/17 1093  . acetaminophen (TYLENOL) suppository 650 mg  650 mg Rectal Q6H PRN Omar Person, NP      . albuterol (PROVENTIL) (2.5 MG/3ML) 0.083% nebulizer solution 2.5 mg  2.5 mg Nebulization Q4H Erick Colace, NP   2.5 mg at 12/06/17 1122  . chlorhexidine gluconate (MEDLINE KIT) (PERIDEX) 0.12 % solution 15 mL  15 mL Mouth Rinse BID Erick Colace, NP   15 mL at 12/06/17 0816  . Chlorhexidine Gluconate Cloth 2 %  PADS 6 each  6 each Topical Q0600 Erick Colace, NP   6 each at 12/05/17 1100  . Chlorhexidine Gluconate Cloth 2 % PADS 6 each  6 each Topical Daily Rigoberto Noel, MD   6 each at 12/06/17 1047  . cycloSPORINE (RESTASIS) 0.05 % ophthalmic emulsion 1 drop  1 drop Both Eyes QHS Ollis, Brandi L, NP   1 drop at 12/05/17 2108  . docusate (COLACE) 50 MG/5ML liquid 100 mg  100 mg Oral BID PRN Mannam, Praveen, MD   100 mg at 12/03/17 0807  . feeding supplement (VITAL AF 1.2 CAL) liquid 1,000 mL  1,000 mL Per Tube Continuous Erick Colace, NP 45 mL/hr at 12/06/17 0728 1,000 mL at 12/06/17 0728  . fentaNYL (SUBLIMAZE) injection 50-100 mcg  50-100 mcg Intravenous Q1H PRN Anders Simmonds, MD   100 mcg at 12/05/17 1222  . heparin injection 5,000 Units  5,000 Units Subcutaneous Q8H Ollis, Brandi L, NP   5,000 Units at 12/06/17 2355  . insulin aspart (novoLOG) injection 0-9 Units  0-9 Units Subcutaneous Q4H Ollis, Brandi L, NP   1 Units at 12/06/17 1146  . lactated ringers infusion   Intravenous Continuous Erick Colace, NP 100 mL/hr at 12/06/17 1038    . levothyroxine (SYNTHROID, LEVOTHROID) tablet 100 mcg  100 mcg Oral QAC breakfast Alfredo Martinez, Brandi L, NP   100 mcg at 12/06/17 0810  . MEDLINE mouth rinse  15 mL Mouth Rinse BID Mannam, Praveen, MD   15 mL at 12/06/17 1130  . norepinephrine (LEVOPHED) '4mg'$  in D5W 287m premix infusion  0-40 mcg/min Intravenous Titrated ZBerton Mount RPH 11.3 mL/hr at 12/06/17 1142 3 mcg/min at 12/06/17 1142  . ondansetron (ZOFRAN) injection 4 mg  4 mg Intravenous Q6H PRN Ollis, Brandi L, NP      . pantoprazole (PROTONIX) injection 40 mg  40 mg Intravenous Q24H OFrederik Pear MD   40 mg at 12/05/17 2111  . piperacillin-tazobactam (ZOSYN) IVPB 3.375 g  3.375 g Intravenous Q8H [place], CHaze Justin RPH   Stopped at 12/06/17 1108  . polyethylene glycol (MIRALAX / GLYCOLAX) packet 17 g  17 g Oral Daily AMarene Lenz MD   17 g at 12/05/17 1129  . polyvinyl alcohol  (LIQUIFILM TEARS) 1.4 % ophthalmic solution 1 drop  1 drop Both Eyes Daily Ollis, Brandi L, NP   1 drop at 12/06/17 0921  . sodium chloride flush (NS) 0.9 % injection 10-40 mL  10-40 mL Intracatheter Q12H ARigoberto Noel MD   10 mL at 12/06/17 1000  . sodium chloride flush (NS) 0.9 % injection 10-40 mL  10-40 mL Intracatheter PRN AKara MeadV, MD      . sodium chloride HYPERTONIC 3 % nebulizer solution 4 mL  4 mL Nebulization Daily BErick Colace  NP      . vancomycin (VANCOCIN) IVPB 750 mg/150 ml premix  750 mg Intravenous Q48H Emiliano Dyer, [facility]   Stopped at 12/06/17 1030     REVIEW OF SYSTEMS: On review of systems, the patient is intubated but had been having mental status changes and had been recovering from a "cold" that had not improved since December 2018. She      PHYSICAL EXAM:  Wt Readings from Last 3 Encounters:  12/06/17 184 lb 1.4 oz (83.5 kg)  06/05/17 154 lb (69.9 kg)  05/13/14 154 lb (69.9 kg)   Temp Readings from Last 3 Encounters:  12/06/17 (!) 100.9 F (38.3 C)  05/13/14 98.1 F (36.7 C) (Tympanic)  06/04/13 98.9 F (37.2 C) (Oral)   BP Readings from Last 3 Encounters:  12/06/17 (!) 94/29  06/05/17 109/72  05/13/14 120/60   Pulse Readings from Last 3 Encounters:  12/06/17 84  06/05/17 90  05/13/14 72   Pain Assessment Pain Score: 0-No pain/10  In general this is an acutely ill caucasian female who is ventilated. She is in not alert or oriented given sendation. HEENT reveals that the patient is normocephalic, atraumatic.  Skin is intact without any evidence of gross lesions. She has anasarca of the extremities. Cardiovascular exam reveals a regular rate and rhythm, no clicks rubs or murmurs are auscultated. Chest is reveals coarse breath sounds to auscultation on the left, and the right upper and middle areas of the chest do not have audible breath sounds. The lower lung on the right has some air movement but is coarse also. No palpable supraclavicular  adenopathy is noted.   ECOG = 4  0 - Asymptomatic (Fully active, able to carry on all predisease activities without restriction)  1 - Symptomatic but completely ambulatory (Restricted in physically strenuous activity but ambulatory and able to carry out work of a light or sedentary nature. For example, light housework, office work)  2 - Symptomatic, <50% in bed during the day (Ambulatory and capable of all self care but unable to carry out any work activities. Up and about more than 50% of waking hours)  3 - Symptomatic, >50% in bed, but not bedbound (Capable of only limited self-care, confined to bed or chair 50% or more of waking hours)  4 - Bedbound (Completely disabled. Cannot carry on any self-care. Totally confined to bed or chair)  5 - Death   Eustace Pen MM, Creech RH, Tormey DC, et al. [phone]). "Toxicity and response criteria of the [facility]". [place] Oncol. 5 (6): 649-55    LABORATORY DATA:  Lab Results  Component Value Date   WBC 29.4 (H) 12/06/2017   HGB 8.0 (L) 12/06/2017   HCT 24.2 (L) 12/06/2017   MCV 84.6 12/06/2017   PLT 222 12/06/2017   Lab Results  Component Value Date   NA 133 (L) 12/06/2017   K 3.8 12/06/2017   CL 106 12/06/2017   CO2 18 (L) 12/06/2017   Lab Results  Component Value Date   ALT 21 12/05/2017   AST 68 (H) 12/05/2017   ALKPHOS 84 12/05/2017   BILITOT 0.6 12/05/2017      RADIOGRAPHY: Dg Chest 1 View  Result Date: 12/04/2017 CLINICAL DATA:  77 year old female with endotracheal tube placement. Subsequent encounter. EXAM: CHEST 1 VIEW COMPARISON:  12/04/2017 6:07 a.m. chest x-ray.  11/27/2017 chest CT. FINDINGS: Endotracheal tube tip 2.5 cm above the carina. Nasogastric tube courses below the diaphragm. Tip is not included  on the present exam. Right hilar/upper lobe mass with obstruction and atelectasis right upper lobe. Bilateral pleural effusions with basilar consolidation suspicious for atelectasis with infiltrate  less likely consideration. Stable mild central pulmonary vascular prominence. IMPRESSION: Endotracheal tube tip 2.5 cm above the carina. Right hilar/upper lobe mass with obstruction and atelectasis of the right upper lobe. Bilateral pleural effusions with basilar consolidation suspicious for atelectasis with infiltrate less likely consideration. Electronically Signed   By: Genia Del M.D.   On: 12/04/2017 08:31   Dg Chest 1 View  Result Date: 11/30/2017 CLINICAL DATA:  Pneumothorax.  Lung mass.  Intubated. EXAM: CHEST 1 VIEW COMPARISON:  Radiograph yesterday at 2038 hour.  CT 11/27/2017 FINDINGS: Endotracheal tube tip at the thoracic inlet. Tip and side port of the enteric tube below the diaphragm in the stomach. No visualized pneumothorax. Right suprahilar lung mass is unchanged from prior exam. Unchanged heart size and mediastinal contours. Developing left lung base opacity likely atelectasis and/or small effusion. IMPRESSION: 1. No evidence of pneumothorax. 2. Developing left lung base opacity may be combination of atelectasis and pleural fluid. 3. Right upper lobe lung mass unchanged from prior exams. Electronically Signed   By: Jeb Levering M.D.   On: 11/30/2017 03:57   Dg Chest 2 View  Result Date: 11/28/2017 CLINICAL DATA:  The acute presentation with unresponsiveness. New diagnosis of lung cancer. EXAM: CHEST - 2 VIEW COMPARISON:  11/23/2017 FINDINGS: Heart size is normal. Right suprahilar mass is redemonstrated. Slight worsening of volume loss and/or infiltrate in the right upper lobe and left lower lobe. No sign of heart failure. IMPRESSION: Redemonstration of right suprahilar mass. Slight worsening of density in the right upper lobe and left lower lobe that could be atelectasis or mild pneumonia. Electronically Signed   By: Nelson Chimes M.D.   On: 12/22/2017 12:07   Dg Chest 2 View  Result Date: 11/23/2017 CLINICAL DATA:  Cough and congestion EXAM: CHEST  2 VIEW COMPARISON:  Feb 13, 2012  FINDINGS: There is an apparent mass in the posterior segment of the right upper lobe with surrounding atelectatic change measuring 5.1 x 3.2 cm. It is less well delineated on the lateral view. Lungs elsewhere are clear. Heart size and pulmonary vascularity are normal. No adenopathy. No bone lesions. IMPRESSION: Apparent mass posterior segment right upper lobe medially. Neoplasm suspected. Advise contrast enhanced chest CT to further evaluate. Lungs elsewhere clear.  No adenopathy appreciable. These results will be called to the ordering clinician or representative by the Radiologist Assistant, and communication documented in the PACS or zVision Dashboard. Electronically Signed   By: Lowella Grip III M.D.   On: 11/23/2017 11:38   Dg Abd 1 View  Result Date: 12/04/2017 CLINICAL DATA:  77 year old female with gastric tube placement. Subsequent encounter. EXAM: ABDOMEN - 1 VIEW COMPARISON:  12/04/2017. FINDINGS: Nasogastric tube tip gastric body level. Side hole fundus-body junction. Nonspecific calcifications mid aspect left abdomen just left of midline possibly pancreatic in origin. Post cholecystectomy. No bowel obstruction. The possibility of free intraperitoneal air cannot be assessed on a supine view. IMPRESSION: Nasogastric tube tip gastric body level. Side hole fundus-body junction. No evidence of bowel obstruction. Nonspecific calcifications mid aspect left abdomen just left of midline possibly pancreatic in origin. Electronically Signed   By: Genia Del M.D.   On: 12/04/2017 08:34   Dg Abdomen 1 View  Result Date: 11/30/2017 CLINICAL DATA:  OG tube placement. EXAM: ABDOMEN - 1 VIEW COMPARISON:  None. FINDINGS: Tip and side  port of the enteric tube below the diaphragm in the stomach. Cholecystectomy clips in the right upper quadrant. Probable ingested material within bowel loops in the central abdomen. IMPRESSION: Tip and side port of the enteric tube below the diaphragm in the stomach.  Electronically Signed   By: Jeb Levering M.D.   On: 12/24/2017 22:02   Ct Head Wo Contrast  Result Date: 11/25/2017 CLINICAL DATA:  Altered level of consciousness. EXAM: CT HEAD WITHOUT CONTRAST TECHNIQUE: Contiguous axial images were obtained from the base of the skull through the vertex without intravenous contrast. COMPARISON:  None. FINDINGS: Brain: Mild chronic ischemic white matter disease is noted. No mass effect or midline shift is noted. Ventricular size is within normal limits. There is no evidence of mass lesion, hemorrhage or acute infarction. Vascular: No hyperdense vessel or unexpected calcification. Skull: Normal. Negative for fracture or focal lesion. Sinuses/Orbits: No acute finding. Other: None. IMPRESSION: Mild chronic ischemic white matter disease. No acute intracranial abnormality seen. Electronically Signed   By: Marijo Conception, M.D.   On: 12/23/2017 13:55   Ct Chest W Contrast  Result Date: 11/27/2017 CLINICAL DATA:  Right lung mass on chest radiograph EXAM: CT CHEST WITH CONTRAST TECHNIQUE: Multidetector CT imaging of the chest was performed during intravenous contrast administration. CONTRAST:  75 mL Isovue 300 IV COMPARISON:  Chest radiograph dated 11/15/2017. CT chest dated 03/13/2008. FINDINGS: Cardiovascular: Heart is normal in size.  No pericardial effusion. No evidence of thoracic aortic aneurysm. Mediastinum/Nodes: 11 mm short axis right hilar node (series 8/image 54). 10 mm short axis subcarinal node (series 8/image 54). 10 mm short axis low right paratracheal node (series 8/image 44). Visualized left thyroid is unremarkable. Lungs/Pleura: 6.1 x 6.0 x 5.8 cm irregular posterior right upper lobe mass extending into the right perihilar region, narrowing right upper lobe bronchi, compatible with primary bronchogenic neoplasm. Mass abuts the right major fissure. Three pleural-based nodules in the right upper lobe measuring up to 4 mm (series 14/images 28, 33, and 52),  nonspecific. Mild linear scarring/atelectasis in the lateral left lower lobe. No focal consolidation. Mild scarring/atelectasis at the right lung base. No pleural effusion or pneumothorax. Upper Abdomen: Bilateral adrenal masses, measuring 5.2 x 1.9 cm on the right and 2.8 x 1.5 cm on the left, indeterminate but worrisome for metastatic disease. These are new from 2009. Multiple scattered hypoenhancing hepatic lesions measuring up to 11 mm in segment 5 (series 8/image 139), worrisome for metastases, although incompletely visualized. Musculoskeletal: Degenerative changes of the visualized thoracolumbar spine. IMPRESSION: 6.1 cm posterior right upper lobe mass, compatible with primary bronchogenic neoplasm, as above. Mass extends to the right perihilar region and abuts the right major fissure. Mediastinal and right hilar nodal metastases. Bilateral adrenal masses, measuring up to 5.2 cm on the right, worrisome for metastatic disease. Scattered hepatic lesions measuring up to 11 mm, indeterminate but worrisome for metastases. Consider PET-CT for complete staging as clinically warranted. Electronically Signed   By: Julian Hy M.D.   On: 11/27/2017 13:34   US Renal  Result Date: 12/07/2017 CLINICAL DATA:  77 y/o  F; acute kidney injury. EXAM: RENAL / URINARY TRACT ULTRASOUND COMPLETE COMPARISON:  None. FINDINGS: Right Kidney: Length: 10.5 cm. Echogenicity within normal limits. No mass or hydronephrosis visualized. Left Kidney: Length: 10.2 cm. Echogenicity within normal limits. No mass or hydronephrosis visualized. Bladder: Appears normal for degree of bladder distention. IMPRESSION: Normal renal ultrasound. Electronically Signed   By: Kristine Garbe M.D.   On: 12/07/2017 19:19  Portable Chest Xray  Result Date: 12/05/2017 CLINICAL DATA:  Intubation. EXAM: PORTABLE CHEST 1 VIEW COMPARISON:  12/04/2017. FINDINGS: Endotracheal tube tip 1.1 cm above the carina. Proximal repositioning of  approximately 2 cm should be considered. Right IJ line in stable position. NG tube in stable position. Heart size stable. Right hilar mass with persistent atelectasis right upper lung again noted. Small bilateral pleural effusions noted. No pneumothorax. IMPRESSION: 1. Endotracheal tube tip 1.1 cm above the carina. Proximal repositioning of approximately 2 cm should be considered. Right IJ line and NG tube in stable position. 2. Right hilar mass with atelectasis of the right upper lung again noted. Bilateral pleural effusions again noted. Electronically Signed   By: Marcello Moores  Register   On: 12/05/2017 09:04   Portable Chest X-ray  Result Date: 12/04/2017 CLINICAL DATA:  77 year old female with right perihilar lung mass, shortness of breath, intubated and enteric tube placed. Central line placement. EXAM: PORTABLE CHEST 1 VIEW COMPARISON:  0803 hours today and earlier. FINDINGS: Portable AP semi upright view at 0846 hours. Right IJ approach central line placed. Catheter tip projects about 1 centimeter below the expected cavoatrial junction level. No pneumothorax. Continued right upper lobe collapse and/or consolidation now obscuring the right hilar and paratracheal masslike opacity seen in February. Stable endotracheal tube and visible enteric tube. Small bilateral pleural effusions are suspected and stable. Other mediastinal contours are stable. Stable cholecystectomy clips. Paucity bowel gas in the upper abdomen. IMPRESSION: 1. Right IJ central line placed, the tip projects about 1 cm below the cavoatrial junction level. No pneumothorax. 2. Right hilar lung mass with right upper collapse and/or consolidation. 3. Small bilateral pleural effusions. No new cardiopulmonary abnormality. Electronically Signed   By: Genevie Ann M.D.   On: 12/04/2017 09:12   Dg Chest Port 1 View  Result Date: 12/04/2017 CLINICAL DATA:  Acute onset of shortness of breath. EXAM: PORTABLE CHEST 1 VIEW COMPARISON:  Chest radiograph performed  12/03/2017 FINDINGS: New right upper lobe and worsening left basilar airspace opacities are compatible with worsening pneumonia. A small left pleural effusion is suspected. No pneumothorax is seen. The cardiomediastinal silhouette is borderline normal in size. No acute osseous abnormalities are identified. IMPRESSION: Worsening bilateral pneumonia noted. Suspect small left pleural effusion. Followup PA and lateral chest X-ray is recommended in 3-4 weeks following trial of antibiotic therapy to ensure resolution and exclude underlying malignancy. Electronically Signed   By: Garald Balding M.D.   On: 12/04/2017 06:24   Dg Chest Port 1 View  Result Date: 12/03/2017 CLINICAL DATA:  Acute respiratory failure. EXAM: PORTABLE CHEST 1 VIEW COMPARISON:  One day prior FINDINGS: Patient rotated right. Midline trachea. Normal heart size. Right paratracheal soft tissue fullness is similar. Left larger than right bilateral pleural effusions are small and similar. No pneumothorax. Bibasilar and right upper lobe airspace disease is not significantly changed. IMPRESSION: No significant change since one day prior. Small bilateral pleural effusions and bibasilar airspace disease. Right upper lobe lung mass and right paratracheal adenopathy, as before. Electronically Signed   By: Abigail Miyamoto M.D.   On: 12/03/2017 07:11   Dg Chest Port 1 View  Result Date: 12/02/2017 CLINICAL DATA:  Acute respiratory failure. EXAM: PORTABLE CHEST 1 VIEW COMPARISON:  12/01/2017 FINDINGS: Endotracheal and enteric tubes have been removed. Abnormal right paratracheal soft tissue density corresponds to known upper lobe mass and lymphadenopathy. There are patchy left greater than right airspace opacities in the lung bases which have significantly improved from prior. Small bilateral pleural  effusions also may have mildly decreased in size. Right upper lobe aeration also appear slightly improved. No pneumothorax is identified. IMPRESSION: 1. Interval  extubation. Improved lung aeration with residual left greater than right basilar atelectasis or infiltrates. 2. Small bilateral pleural effusions. 3. Known right upper lobe mass and lymphadenopathy. Electronically Signed   By: Logan Bores M.D.   On: 12/02/2017 07:01   Dg Chest Port 1 View  Result Date: 12/01/2017 CLINICAL DATA:  Acute respiratory failure with hypoxia EXAM: PORTABLE CHEST 1 VIEW COMPARISON:  12/01/2007 FINDINGS: Endotracheal tube in good position. Gastric tube enters the stomach with the tip not visualized Right upper lobe mass lesion and paratracheal adenopathy unchanged. Progressive bibasilar airspace disease. Progression of bilateral pleural effusions. Progression of right upper lobe airspace disease. IMPRESSION: Progression of bilateral airspace disease and small pleural effusions. Possible edema or pneumonia Right upper lobe mass lesion Electronically Signed   By: Franchot Gallo M.D.   On: 12/01/2017 07:02   Dg Chest Port 1 View  Result Date: 11/25/2017 CLINICAL DATA:  Status post intubation EXAM: PORTABLE CHEST 1 VIEW COMPARISON:  12/14/2017 FINDINGS: Cardiac shadow is stable. Endotracheal tube and nasogastric catheter are noted in satisfactory position. Persistent right upper lobe mass lesion is again seen and stable. The overall inspiratory effort is poor. No bony abnormality is seen. IMPRESSION: Tubes and lines as described above. Stable right upper lobe mass. Electronically Signed   By: Inez Catalina M.D.   On: 12/20/2017 22:00       IMPRESSION/PLAN: 1. 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 Dr. Lisbeth Renshaw has reviewed her case and she is appropriate to consider palliative radiotherapy to the chest. We will mark and start tomorrow, and anticipate improvement in her tumor size and opening of the RUL in the next week and a half. We discussed the risks, benefits, short, and long term effects of radiotherapy, and the patient's husband, her POA is interested in the  patient proceeding. We discusses the delivery and logistics of radiotherapy and anticipate a course of 2 weeks. We will simulate tomorrow at 1pm and sign consent at that time with her husband. She will also need additional staging and I've ordered a brain MRI and she will have a PET as an outpatient. Dr. Julien Nordmann has also seen her and will plan systemic therapy once she's outpatient, provided this is possible.  In a visit lasting 70 minutes, greater than 50% of the time was spent face to face discussing her diagnosis with her family, and in floor time planning for her case, and coordinating the patient's care.    Carola Rhine, PAC

## 2017-12-06 NOTE — Progress Notes (Signed)
PULMONARY / CRITICAL CARE MEDICINE   Name: Carol Powers MRN: [id number] DOB: 08/20/1941    ADMISSION DATE:  12/21/2017 CONSULTATION DATE:  12/21/2017  REFERRING MD:  Dr. 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  Mediastinal & right hildar nodal metastases.  Bilateral adrenal masses measuring up to 5.2 cm on the R worrisome for metastatic disease.  Scattered hepatic lesions measuring up to 51m, indeterminate but worrisome for metastases.   Renal [place]3/6 >> normal renal [place] Head 3/6 >> mild chronic ischemic white matter disease. No acute abnormality.   CULTURES: BCx2 3/6 >> ng BC 3/10 MRSE 2/2  UA 3/6 >> concern for UTI  UC 3/6 >> greater 100k E-Coli >> pan sensitive   ANTIBIOTICS: Vanco 3/6 x1 Zosyn 3/6 x1 Rocephin (UTI) 3/7 >> 3/10 Azithro (L infiltrate) 3/7 >> 3/10 Zosyn 3/10 >> vanc 3/10 >>  SIGNIFICANT EVENTS: 3/06  Admit, initially improved with IVF > decompensated late PM / intubated  3/07  Levophed 22 mcg, Vasopressin. CVP 10 3/8 Off pressors, extubated. 3/11 Worsened overnight with HFnC, then altered mental status this am requiring re-intubation by anesthesia  LINES/TUBES: ETT 3/6 >> 3/8, 3/11  >> Rt femoral TLC 3/7 >>3/9 A line 3/7 >>3/9 RIJ 3/11 >>  SUBJECTIVE:   Remains critically ill VITAL SIGNS: BP (!) 94/29 (BP Location: Left Arm)   Pulse 87   Temp (!) 101.5 F (38.6 C) (Bladder)   Resp (!) 23   Ht 5'  1" (1.549 m)   Wt 184 lb 1.4 oz (83.5 kg)   SpO2 92%   BMI 34.78 kg/m   HEMODYNAMICS:    VENTILATOR SETTINGS: Vent Mode: PRVC FiO2 (%):  [40 %-50 %] 40 % Set Rate:  [20 bmp] 20 bmp Vt Set:  [400 mL] 400 mL PEEP:  [5 cmH20] 5 cmH20 Pressure Support:  [10 cmH20] 10 cmH20 Plateau Pressure:  [19 cmH20-22 cmH20] 21 cmH20  INTAKE / OUTPUT:  Intake/Output Summary (Last 24 hours) at 12/06/2017 1039 Last data filed at 12/06/2017 0800 Gross per 24 hour  Intake 4456.02 ml  Output 435 ml  Net 4021.02 ml     PHYSICAL EXAMINATION: General: This is a critically ill-appearing 77year old white female she is currently minimally responsive on ventilator, desaturating and hypotensive on my exam this morning HEENT: Orally intubated does appear to have audible air leak from endotracheal tube no JVD mucous membranes moist Pulmonary: Diffuse rhonchi diminished right upper lobe marked accessory use particularly after being turned to the right side Cardiac: Regular rate and rhythm without murmur rub or gallop Abdomen: Little more distended, hypoactive, positive bowel sounds, BM this a.m. Neuro/psych: Moving extremities, not responsive, no focal deficits noted. GU: Clear concentrated yellow Extremities/musculoskeletal: Warm dry positive edema  LABS:  BMET Recent Labs  Lab 12/04/17 0659 12/05/17 0600 12/06/17 0436  NA 133* 135 133*  K 2.3* 3.7 3.8  CL 107 107 106  CO2 21* 20*  18*  BUN 22* 30* 41*  CREATININE 1.04* 1.43* 1.98*  GLUCOSE 335* 92 127*    Electrolytes Recent Labs  Lab 12/03/17 0434 12/04/17 0659 12/05/17 0600 12/06/17 0436  CALCIUM 9.0 7.0* 7.9* 7.8*  MG 1.7 1.3* 2.0  --   PHOS 1.2* 1.9* 2.9  --     CBC Recent Labs  Lab 12/04/17 0659 12/05/17 0600 12/06/17 0436  WBC 19.2* 26.1* 29.4*  HGB 7.0* 9.0* 8.0*  HCT 21.2* 27.1* 24.2*  PLT 212 232 222    Coag's No results for input(s): APTT, INR in the last 168 hours.  Sepsis Markers Recent Labs  Lab  12/22/2017 2144 12/04/17 0659 12/04/17 1110  LATICACIDVEN 3.3* 2.3* 1.8  PROCALCITON  --  8.62  --     ABG Recent Labs  Lab 11/30/17 0616 12/04/17 0528 12/04/17 1225  PHART 7.284* 7.275* 7.294*  PCO2ART 26.2* 51.7* 43.3  PO2ART 124* 59.0* 126*    Liver Enzymes Recent Labs  Lab 11/27/2017 1133 12/05/17 0600  AST 38 68*  ALT 17 21  ALKPHOS 98 84  BILITOT 1.1 0.6  ALBUMIN 3.1* 1.5*    Cardiac Enzymes Recent Labs  Lab 12/04/17 0659 12/04/17 1110 12/04/17 1815  TROPONINI 0.04* 0.03* 0.04*    Glucose Recent Labs  Lab 12/05/17 1204 12/05/17 1606 12/05/17 1937 12/05/17 2314 12/06/17 0314 12/06/17 0734  GLUCAP 118* 91 109* 109* 115* 111*    Imaging No results found.   DISCUSSION: 77 y/o F, former remote smoker (50 years ago), admitted with hypotension and altered mental status. Treated for e coli  UTI.   Recent discovery of lung mass with adrenal/ liver mets. Reintubated 3/11 for acute hyerpcarbic resp failure due to ? 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 Bibasilar effusions/vol loss -Rapidly decompensates when turned on the right side resulting in desaturation and hypotension Plan Cont full vent support increase PEEP to 8 VAP interventions Repeat PCXR today as well as ABG PAD protocol RAS goal: 0 Cont BDs, add hypertonic saline Wean FIO2 for sats > 90%  Septic Shock - in setting of post obstructive PNA, pan sens E-Coli UTI & additionally hypovolemia (HCTZ), narcotics, poor PO intake contributing  -fever curve stable  -wbc pending -1/2 Coag neg SA  from blood suspect contamination  Plan Holding ace-I, diuretic and norvasc SBP goal > 100 w/ levophed CVP goal  8-12 Cont tele  Day # 3 vanc & #4 zosyn  Trend PCT  AKI - suspect sepsis, hypovolemia, NSAIDS, hypotension, ACE-I Metabolic acidosis (NAG) Intermittent Fluid and electrolyte imbalance  Plan Repeat chemistry Strict I&O Avoid hypotension  Change IVF to LR Replace lytes as indicated   Hypoglycemia now resolved Plan Discontinue D5 water  Anemia  S/p 1 U PRBC 3/11 -hgb w/ approp bump; no evidence of bleeding  Plan Trend cbc Transfuse per ICU protocol as indicated  Protein calorie malnutrition -moderate Plan  Cont tubefeeds Bowel regimen   Hypothyroidism Plan Cont synthroid     Acute encephalopathy c/b Pain - suspect secondary to metastatic disease   H/o Depression, anxiety Plan RASS goal 0 Supportive care Not stable  enough to mobilize out of bed as of yet Consider CT imaging of head in the next 24 hours if no improvement  FAMILY  - Updates: Husband, daughter updated at bedside.   - Inter-disciplinary family meet or Palliative Care meeting due by:  3/12  My cct 45 min  DVT prophylaxis: [place] heparin  SUP: PPI  Diet: tubefeed Activity: BR Disposition : ICU critically ill   Erick Colace ACNP-BC [place] Pager # [phone] OR # [phone] if no answer   12/06/2017, 10:39 AM

## 2017-12-06 NOTE — Consult Note (Signed)
Carol Powers [phone]   [phone]  CONSULT NOTE  REFERRING PHYSICIAN: Dr. Kara Mead  REASON FOR CONSULTATION:  77 years old white female recently diagnosed with lung cancer.  HPI Carol Powers is a 77 y.o. female with past medical history significant for fibromyalgia, hypertension, hypothyroidism, macular degeneration as well as GERD and diverticulosis.  The patient has remote history for smoking.  She has been complaining of cold symptoms and chest congestion since December 2018.  She was seen by her primary care physician and treated with several courses of antibiotics with no significant improvement.  She was also complaining of leg and lower back pain and treated with tramadol with no improvement.  On November 23, 2017 she had chest x-ray performed and it showed apparent mass measuring 5.1 x 3.2 cm in the posterior segment of the right upper lobe medially and neoplasm was suspected.  This was followed by CT scan of the chest with contrast on November 27, 2017 and it showed 6.1 x 6.0 x 5.8 cm irregular posterior right upper lobe mass extending into the right perihilar region, narrowing the right upper lobe bronchi compatible with primary bronchogenic neoplasm.  The mass abuts the right major fissure.  There were 3 pleural based nodules in the right upper lobe measuring up to 4 mm and were nonspecific.  The scan also showed mediastinal lymphadenopathy including 1.0 cm subcarinal lymph node and 1.0 cm short axis low right paratracheal node as well as 1.1 cm right hilar node.  Scan of the upper abdomen showed bilateral adrenal masses measuring 5.2 x 1.9 cm on the right and 2.8 x 1.5 cm on the left that were indeterminate but worrisome for metastatic disease.  There was also multiple scattered hypoenhancing hepatic lesions measuring 1.1 cm and worrisome for metastasis. The patient was referred to me to be seen at the cancer center last week but on the day of  her visit she has been complaining of increasing fatigue and weakness and she was unable to make it to the clinic.  She was admitted to [facility] with severe weakness as well as shortness of breath.  CT of the head without contrast on 12/20/2017 showed mild chronic ischemic white matter disease but no acute intracranial abnormality seen. On December 04, 2017 the patient underwent bronchoscopy under the care of Dr. Elsworth Soho and the final pathology from the biopsy of the right upper lobe was consistent with non-small cell carcinoma favoring adenocarcinoma but there was insufficient material for molecular studies. Dr. Elsworth Soho kindly asking me to see the patient today for evaluation and recommendation regarding treatment of her condition. When seen today the patient was intubated and sedated.  Her husband, Carol Powers and her granddaughter Carol Powers where available at the bedside and the provided the history.  She did quit smoking in 1968.  HPI  Past Medical History:  Diagnosis Date  . Carpal tunnel syndrome   . Carpal tunnel syndrome   . Diverticulosis   . Fibrocystic breast disease   . Fibromyalgia   . HTN (hypertension)   . Hypothyroidism   . IBS (irritable bowel syndrome)   . Macular degeneration   . Migraine   . PONV (postoperative nausea and vomiting)   . Reflux   . Sty    right eye last few days, no drainage    Past Surgical History:  Procedure Laterality Date  . APPENDECTOMY    . arthroscopic knee surgery Bilateral yrs ago  . CHOLECYSTECTOMY  9 yrs ago  . COLONOSCOPY WITH PROPOFOL N/A 05/13/2014   Procedure: COLONOSCOPY WITH PROPOFOL;  Surgeon: Garlan Fair, MD;  Location: [facility];  Service: Endoscopy;  Laterality: N/A;  . REPLACEMENT TOTAL KNEE Right 7 yrs ago  . thryoid surgery  20 yrs ago   partial    Family History  Problem Relation Age of Onset  . Heart failure Father   . Cancer Mother     Social History Social History   Tobacco Use  . Smoking status: Former  Smoker    Packs/day: 0.25    Years: 2.00    Pack years: 0.50    Types: Cigarettes    Last attempt to quit: 06/05/1967    Years since quitting: 50.5  . Smokeless tobacco: Never Used  Substance Use Topics  . Alcohol use: Yes    Alcohol/week: 1.0 oz    Types: 2 Standard drinks or equivalent per week  . Drug use: No    Allergies  Allergen Reactions  . Beta Adrenergic Blockers Other (See Comments)    Nightmares  . Codeine Nausea And Vomiting    severe  . Dexilant [Dexlansoprazole]     Rapid heart rate   . Erythromycin     Messed liver up  . Septra [Sulfamethoxazole-Trimethoprim]     Blisters     Current Facility-Administered Medications  Medication Dose Route Frequency Provider Last Rate Last Dose  . 0.9 %  sodium chloride infusion   Intravenous Continuous Rigoberto Noel, MD 50 mL/hr at 12/05/17 0953    . 0.9 %  sodium chloride infusion   Intravenous Once Rigoberto Noel, MD      . acetaminophen (TYLENOL) solution 650 mg  650 mg Per Tube Q6H PRN Anders Simmonds, MD   650 mg at 12/06/17 1165  . acetaminophen (TYLENOL) suppository 650 mg  650 mg Rectal Q6H PRN Omar Person, NP      . albuterol (PROVENTIL) (2.5 MG/3ML) 0.083% nebulizer solution 2.5 mg  2.5 mg Nebulization Q4H Erick Colace, NP   2.5 mg at 12/06/17 1122  . chlorhexidine gluconate (MEDLINE KIT) (PERIDEX) 0.12 % solution 15 mL  15 mL Mouth Rinse BID Erick Colace, NP   15 mL at 12/06/17 0816  . Chlorhexidine Gluconate Cloth 2 % PADS 6 each  6 each Topical Q0600 Erick Colace, NP   6 each at 12/05/17 1100  . Chlorhexidine Gluconate Cloth 2 % PADS 6 each  6 each Topical Daily Rigoberto Noel, MD   6 each at 12/06/17 1047  . cycloSPORINE (RESTASIS) 0.05 % ophthalmic emulsion 1 drop  1 drop Both Eyes QHS Ollis, Brandi L, NP   1 drop at 12/05/17 2108  . docusate (COLACE) 50 MG/5ML liquid 100 mg  100 mg Oral BID PRN Mannam, Praveen, MD   100 mg at 12/03/17 0807  . feeding supplement (VITAL AF 1.2 CAL) liquid  1,000 mL  1,000 mL Per Tube Continuous Erick Colace, NP 55 mL/hr at 12/06/17 1438 1,000 mL at 12/06/17 1438  . fentaNYL (SUBLIMAZE) injection 50-100 mcg  50-100 mcg Intravenous Q1H PRN Anders Simmonds, MD   100 mcg at 12/05/17 1222  . heparin injection 5,000 Units  5,000 Units Subcutaneous Q8H Ollis, Brandi L, NP   5,000 Units at 12/06/17 1432  . insulin aspart (novoLOG) injection 0-9 Units  0-9 Units Subcutaneous Q4H Ollis, Brandi L, NP   1 Units at 12/06/17 1146  . lactated ringers infusion  Intravenous Continuous Erick Colace, NP 100 mL/hr at 12/06/17 1038    . levothyroxine (SYNTHROID, LEVOTHROID) tablet 100 mcg  100 mcg Oral QAC breakfast Alfredo Martinez, Brandi L, NP   100 mcg at 12/06/17 0810  . MEDLINE mouth rinse  15 mL Mouth Rinse BID Mannam, Praveen, MD   15 mL at 12/06/17 1130  . norepinephrine (LEVOPHED) 61m in D5W 2584mpremix infusion  0-40 mcg/min Intravenous Titrated ZeBerton MountRPH 3.8 mL/hr at 12/06/17 1431 1 mcg/min at 12/06/17 1431  . ondansetron (ZOFRAN) injection 4 mg  4 mg Intravenous Q6H PRN Ollis, Brandi L, NP      . pantoprazole (PROTONIX) injection 40 mg  40 mg Intravenous Q24H OgFrederik PearMD   40 mg at 12/05/17 2111  . piperacillin-tazobactam (ZOSYN) IVPB 3.375 g  3.375 g Intravenous Q8H Shade, Christine E, RPH 12.5 mL/hr at 12/06/17 1432 3.375 g at 12/06/17 1432  . polyethylene glycol (MIRALAX / GLYCOLAX) packet 17 g  17 g Oral Daily AdMarene LenzMD   17 g at 12/05/17 1129  . polyvinyl alcohol (LIQUIFILM TEARS) 1.4 % ophthalmic solution 1 drop  1 drop Both Eyes Daily Ollis, Brandi L, NP   1 drop at 12/06/17 0921  . sodium chloride flush (NS) 0.9 % injection 10-40 mL  10-40 mL Intracatheter Q12H AlRigoberto NoelMD   10 mL at 12/06/17 1000  . sodium chloride flush (NS) 0.9 % injection 10-40 mL  10-40 mL Intracatheter PRN AlRigoberto NoelMD      . sodium chloride HYPERTONIC 3 % nebulizer solution 4 mL  4 mL Nebulization Daily BaErick ColaceNP      .  vancomycin (VANCOCIN) IVPB 750 mg/150 ml premix  750 mg Intravenous Q48H WiEmiliano DyerRPPeninsula [facility] LLC Stopped at 12/06/17 1030    Review of Systems  Review of systems not obtained due to patient factors.  Physical Exam  RAEAV:WUJWJXBJYNill looking, uncooperative and sedated and intubated SKIN: skin color, texture, turgor are normal, no rashes or significant lesions HEAD: Normocephalic, No masses, lesions, tenderness or abnormalities EYES: normal, PERRLA EARS: External ears normal, Canals clear OROPHARYNX:no exudate and no erythema  NECK: supple, no adenopathy, no JVD LYMPH:  no palpable lymphadenopathy, no hepatosplenomegaly BREAST:not examined LUNGS: decreased breath sounds, prolonged expiratory phase, expiratory wheezes bilaterally HEART: regular rate & rhythm, no murmurs and no gallops ABDOMEN:abdomen soft, non-tender, normal bowel sounds and no masses or organomegaly BACK: No CVA tenderness, Range of motion is normal EXTREMITIES:no joint deformities, effusion, or inflammation, no edema  NEURO: no focal motor/sensory deficits  LABORATORY DATA: Lab Results  Component Value Date   WBC 29.4 (H) 12/06/2017   HGB 8.0 (L) 12/06/2017   HCT 24.2 (L) 12/06/2017   MCV 84.6 12/06/2017   PLT 222 12/06/2017    _0 @  RADIOGRAPHIC STUDIES: Dg Chest 1 View  Result Date: 12/04/2017 CLINICAL DATA:  [age]ear old female with endotracheal tube placement. Subsequent encounter. EXAM: CHEST 1 VIEW COMPARISON:  12/04/2017 6:07 a.m. chest x-ray.  11/27/2017 chest CT. FINDINGS: Endotracheal tube tip 2.5 cm above the carina. Nasogastric tube courses below the diaphragm. Tip is not included on the present exam. Right hilar/upper lobe mass with obstruction and atelectasis right upper lobe. Bilateral pleural effusions with basilar consolidation suspicious for atelectasis with infiltrate less likely consideration. Stable mild central pulmonary vascular prominence. IMPRESSION: Endotracheal tube tip 2.5  cm above the carina. Right hilar/upper lobe mass with obstruction and atelectasis of the right upper lobe.  Bilateral pleural effusions with basilar consolidation suspicious for atelectasis with infiltrate less likely consideration. Electronically Signed   By: Genia Del M.D.   On: 12/04/2017 08:31   Dg Chest 1 View  Result Date: 11/30/2017 CLINICAL DATA:  Pneumothorax.  Lung mass.  Intubated. EXAM: CHEST 1 VIEW COMPARISON:  Radiograph yesterday at 2038 hour.  CT 11/27/2017 FINDINGS: Endotracheal tube tip at the thoracic inlet. Tip and side port of the enteric tube below the diaphragm in the stomach. No visualized pneumothorax. Right suprahilar lung mass is unchanged from prior exam. Unchanged heart size and mediastinal contours. Developing left lung base opacity likely atelectasis and/or small effusion. IMPRESSION: 1. No evidence of pneumothorax. 2. Developing left lung base opacity may be combination of atelectasis and pleural fluid. 3. Right upper lobe lung mass unchanged from prior exams. Electronically Signed   By: Jeb Levering M.D.   On: 11/30/2017 03:57   Dg Chest 2 View  Result Date: 12/16/2017 CLINICAL DATA:  The acute presentation with unresponsiveness. New diagnosis of lung cancer. EXAM: CHEST - 2 VIEW COMPARISON:  11/23/2017 FINDINGS: Heart size is normal. Right suprahilar mass is redemonstrated. Slight worsening of volume loss and/or infiltrate in the right upper lobe and left lower lobe. No sign of heart failure. IMPRESSION: Redemonstration of right suprahilar mass. Slight worsening of density in the right upper lobe and left lower lobe that could be atelectasis or mild pneumonia. Electronically Signed   By: Nelson Chimes M.D.   On: 12/02/2017 12:07   Dg Chest 2 View  Result Date: 11/23/2017 CLINICAL DATA:  Cough and congestion EXAM: CHEST  2 VIEW COMPARISON:  Feb 13, 2012 FINDINGS: There is an apparent mass in the posterior segment of the right upper lobe with surrounding atelectatic  change measuring 5.1 x 3.2 cm. It is less well delineated on the lateral view. Lungs elsewhere are clear. Heart size and pulmonary vascularity are normal. No adenopathy. No bone lesions. IMPRESSION: Apparent mass posterior segment right upper lobe medially. Neoplasm suspected. Advise contrast enhanced chest CT to further evaluate. Lungs elsewhere clear.  No adenopathy appreciable. These results will be called to the ordering clinician or representative by the Radiologist Assistant, and communication documented in the PACS or zVision Dashboard. Electronically Signed   By: Lowella Grip III M.D.   On: 11/23/2017 11:38   Dg Abd 1 View  Result Date: 12/04/2017 CLINICAL DATA:  77 year old female with gastric tube placement. Subsequent encounter. EXAM: ABDOMEN - 1 VIEW COMPARISON:  12/08/2017. FINDINGS: Nasogastric tube tip gastric body level. Side hole fundus-body junction. Nonspecific calcifications mid aspect left abdomen just left of midline possibly pancreatic in origin. Post cholecystectomy. No bowel obstruction. The possibility of free intraperitoneal air cannot be assessed on a supine view. IMPRESSION: Nasogastric tube tip gastric body level. Side hole fundus-body junction. No evidence of bowel obstruction. Nonspecific calcifications mid aspect left abdomen just left of midline possibly pancreatic in origin. Electronically Signed   By: Genia Del M.D.   On: 12/04/2017 08:34   Dg Abdomen 1 View  Result Date: 12/15/2017 CLINICAL DATA:  OG tube placement. EXAM: ABDOMEN - 1 VIEW COMPARISON:  None. FINDINGS: Tip and side port of the enteric tube below the diaphragm in the stomach. Cholecystectomy clips in the right upper quadrant. Probable ingested material within bowel loops in the central abdomen. IMPRESSION: Tip and side port of the enteric tube below the diaphragm in the stomach. Electronically Signed   By: Jeb Levering M.D.   On: 12/20/2017 22:02  Ct Head Wo Contrast  Result Date:  12/18/2017 CLINICAL DATA:  Altered level of consciousness. EXAM: CT HEAD WITHOUT CONTRAST TECHNIQUE: Contiguous axial images were obtained from the base of the skull through the vertex without intravenous contrast. COMPARISON:  None. FINDINGS: Brain: Mild chronic ischemic white matter disease is noted. No mass effect or midline shift is noted. Ventricular size is within normal limits. There is no evidence of mass lesion, hemorrhage or acute infarction. Vascular: No hyperdense vessel or unexpected calcification. Skull: Normal. Negative for fracture or focal lesion. Sinuses/Orbits: No acute finding. Other: None. IMPRESSION: Mild chronic ischemic white matter disease. No acute intracranial abnormality seen. Electronically Signed   By: Marijo Conception, M.D.   On: 12/04/2017 13:55   Ct Chest W Contrast  Result Date: 11/27/2017 CLINICAL DATA:  Right lung mass on chest radiograph EXAM: CT CHEST WITH CONTRAST TECHNIQUE: Multidetector CT imaging of the chest was performed during intravenous contrast administration. CONTRAST:  75 mL Isovue 300 IV COMPARISON:  Chest radiograph dated 11/15/2017. CT chest dated 03/13/2008. FINDINGS: Cardiovascular: Heart is normal in size.  No pericardial effusion. No evidence of thoracic aortic aneurysm. Mediastinum/Nodes: 11 mm short axis right hilar node (series 8/image 54). 10 mm short axis subcarinal node (series 8/image 54). 10 mm short axis low right paratracheal node (series 8/image 44). Visualized left thyroid is unremarkable. Lungs/Pleura: 6.1 x 6.0 x 5.8 cm irregular posterior right upper lobe mass extending into the right perihilar region, narrowing right upper lobe bronchi, compatible with primary bronchogenic neoplasm. Mass abuts the right major fissure. Three pleural-based nodules in the right upper lobe measuring up to 4 mm (series 14/images 28, 33, and 52), nonspecific. Mild linear scarring/atelectasis in the lateral left lower lobe. No focal consolidation. Mild  scarring/atelectasis at the right lung base. No pleural effusion or pneumothorax. Upper Abdomen: Bilateral adrenal masses, measuring 5.2 x 1.9 cm on the right and 2.8 x 1.5 cm on the left, indeterminate but worrisome for metastatic disease. These are new from 2009. Multiple scattered hypoenhancing hepatic lesions measuring up to 11 mm in segment 5 (series 8/image 139), worrisome for metastases, although incompletely visualized. Musculoskeletal: Degenerative changes of the visualized thoracolumbar spine. IMPRESSION: 6.1 cm posterior right upper lobe mass, compatible with primary bronchogenic neoplasm, as above. Mass extends to the right perihilar region and abuts the right major fissure. Mediastinal and right hilar nodal metastases. Bilateral adrenal masses, measuring up to 5.2 cm on the right, worrisome for metastatic disease. Scattered hepatic lesions measuring up to 11 mm, indeterminate but worrisome for metastases. Consider PET-CT for complete staging as clinically warranted. Electronically Signed   By: Julian Hy M.D.   On: 11/27/2017 13:34   US Renal  Result Date: 12/13/2017 CLINICAL DATA:  77 y/o  F; acute kidney injury. EXAM: RENAL / URINARY TRACT ULTRASOUND COMPLETE COMPARISON:  None. FINDINGS: Right Kidney: Length: 10.5 cm. Echogenicity within normal limits. No mass or hydronephrosis visualized. Left Kidney: Length: 10.2 cm. Echogenicity within normal limits. No mass or hydronephrosis visualized. Bladder: Appears normal for degree of bladder distention. IMPRESSION: Normal renal ultrasound. Electronically Signed   By: Kristine Garbe M.D.   On: 12/02/2017 19:19   Portable Chest Xray  Result Date: 12/05/2017 CLINICAL DATA:  Intubation. EXAM: PORTABLE CHEST 1 VIEW COMPARISON:  12/04/2017. FINDINGS: Endotracheal tube tip 1.1 cm above the carina. Proximal repositioning of approximately 2 cm should be considered. Right IJ line in stable position. NG tube in stable position. Heart size  stable. Right hilar mass with persistent  atelectasis right upper lung again noted. Small bilateral pleural effusions noted. No pneumothorax. IMPRESSION: 1. Endotracheal tube tip 1.1 cm above the carina. Proximal repositioning of approximately 2 cm should be considered. Right IJ line and NG tube in stable position. 2. Right hilar mass with atelectasis of the right upper lung again noted. Bilateral pleural effusions again noted. Electronically Signed   By: Marcello Moores  Register   On: 12/05/2017 09:04   Portable Chest X-ray  Result Date: 12/04/2017 CLINICAL DATA:  77 year old female with right perihilar lung mass, shortness of breath, intubated and enteric tube placed. Central line placement. EXAM: PORTABLE CHEST 1 VIEW COMPARISON:  0803 hours today and earlier. FINDINGS: Portable AP semi upright view at 0846 hours. Right IJ approach central line placed. Catheter tip projects about 1 centimeter below the expected cavoatrial junction level. No pneumothorax. Continued right upper lobe collapse and/or consolidation now obscuring the right hilar and paratracheal masslike opacity seen in February. Stable endotracheal tube and visible enteric tube. Small bilateral pleural effusions are suspected and stable. Other mediastinal contours are stable. Stable cholecystectomy clips. Paucity bowel gas in the upper abdomen. IMPRESSION: 1. Right IJ central line placed, the tip projects about 1 cm below the cavoatrial junction level. No pneumothorax. 2. Right hilar lung mass with right upper collapse and/or consolidation. 3. Small bilateral pleural effusions. No new cardiopulmonary abnormality. Electronically Signed   By: Genevie Ann M.D.   On: 12/04/2017 09:12   Dg Chest Port 1 View  Result Date: 12/04/2017 CLINICAL DATA:  Acute onset of shortness of breath. EXAM: PORTABLE CHEST 1 VIEW COMPARISON:  Chest radiograph performed 12/03/2017 FINDINGS: New right upper lobe and worsening left basilar airspace opacities are compatible with  worsening pneumonia. A small left pleural effusion is suspected. No pneumothorax is seen. The cardiomediastinal silhouette is borderline normal in size. No acute osseous abnormalities are identified. IMPRESSION: Worsening bilateral pneumonia noted. Suspect small left pleural effusion. Followup PA and lateral chest X-ray is recommended in 3-4 weeks following trial of antibiotic therapy to ensure resolution and exclude underlying malignancy. Electronically Signed   By: Garald Balding M.D.   On: 12/04/2017 06:24   Dg Chest Port 1 View  Result Date: 12/03/2017 CLINICAL DATA:  Acute respiratory failure. EXAM: PORTABLE CHEST 1 VIEW COMPARISON:  One day prior FINDINGS: Patient rotated right. Midline trachea. Normal heart size. Right paratracheal soft tissue fullness is similar. Left larger than right bilateral pleural effusions are small and similar. No pneumothorax. Bibasilar and right upper lobe airspace disease is not significantly changed. IMPRESSION: No significant change since one day prior. Small bilateral pleural effusions and bibasilar airspace disease. Right upper lobe lung mass and right paratracheal adenopathy, as before. Electronically Signed   By: Abigail Miyamoto M.D.   On: 12/03/2017 07:11   Dg Chest Port 1 View  Result Date: 12/02/2017 CLINICAL DATA:  Acute respiratory failure. EXAM: PORTABLE CHEST 1 VIEW COMPARISON:  12/01/2017 FINDINGS: Endotracheal and enteric tubes have been removed. Abnormal right paratracheal soft tissue density corresponds to known upper lobe mass and lymphadenopathy. There are patchy left greater than right airspace opacities in the lung bases which have significantly improved from prior. Small bilateral pleural effusions also may have mildly decreased in size. Right upper lobe aeration also appear slightly improved. No pneumothorax is identified. IMPRESSION: 1. Interval extubation. Improved lung aeration with residual left greater than right basilar atelectasis or infiltrates.  2. Small bilateral pleural effusions. 3. Known right upper lobe mass and lymphadenopathy. Electronically Signed   By: Zenia Resides  Jeralyn Ruths M.D.   On: 12/02/2017 07:01   Dg Chest Port 1 View  Result Date: 12/01/2017 CLINICAL DATA:  Acute respiratory failure with hypoxia EXAM: PORTABLE CHEST 1 VIEW COMPARISON:  12/01/2007 FINDINGS: Endotracheal tube in good position. Gastric tube enters the stomach with the tip not visualized Right upper lobe mass lesion and paratracheal adenopathy unchanged. Progressive bibasilar airspace disease. Progression of bilateral pleural effusions. Progression of right upper lobe airspace disease. IMPRESSION: Progression of bilateral airspace disease and small pleural effusions. Possible edema or pneumonia Right upper lobe mass lesion Electronically Signed   By: Franchot Gallo M.D.   On: 12/01/2017 07:02   Dg Chest Port 1 View  Result Date: 12/24/2017 CLINICAL DATA:  Status post intubation EXAM: PORTABLE CHEST 1 VIEW COMPARISON:  12/20/2017 FINDINGS: Cardiac shadow is stable. Endotracheal tube and nasogastric catheter are noted in satisfactory position. Persistent right upper lobe mass lesion is again seen and stable. The overall inspiratory effort is poor. No bony abnormality is seen. IMPRESSION: Tubes and lines as described above. Stable right upper lobe mass. Electronically Signed   By: Inez Catalina M.D.   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   PLAN: I had a lengthy discussion with the patient and his family who were at the bedside about her current condition, prognosis and treatment options. The patient will need to complete the staging  workup by ordering a PET scan as well as MRI of the brain but this can be done on outpatient basis after discharge. Definitely the patient would benefit from a course of palliative radiotherapy to the large right upper lobe lung mass and mediastinum to help with her respiratory status during her hospitalization. The patient has remote history of smoking and there is a good possibility that she may have molecular mutation in her tumor but the tissue sample was insufficient for molecular testing.  I will send a blood sample to [facility] for EGFR mutational status.  I would also consider the patient for molecular studies by [facility] after discharge. I discussed my recommendation with the family and they are in agreement with the current plan. They understand that she has an incurable condition and all the treatment will be of palliative nature. The patient voices understanding of current disease status and treatment options and is in agreement with the current care plan.  All questions were answered. The patient knows to call the clinic with any problems, questions or concerns. We can certainly see the patient much sooner if necessary.  Thank you so much for allowing me to participate in the care of RAELEY GILMORE. I will continue to follow up the patient with you and assist in her care.  Disclaimer: This note was dictated with voice recognition software. Similar sounding words can inadvertently be transcribed and may not be corrected upon review.   Eilleen Kempf December 06, 2017, 2:49 PM

## 2017-12-07 ENCOUNTER — Inpatient Hospital Stay (HOSPITAL_COMMUNITY): Payer: Medicare Other

## 2017-12-07 ENCOUNTER — Ambulatory Visit
Admit: 2017-12-07 | Discharge: 2017-12-07 | Disposition: A | Discharge status: Home / Self Care | Payer: Medicare Other | Attending: Radiation Oncology | Admitting: Radiation Oncology

## 2017-12-07 DIAGNOSIS — C3411 Malignant neoplasm of upper lobe, right bronchus or lung: Secondary | ICD-10-CM

## 2017-12-07 LAB — BASIC METABOLIC PANEL
Anion gap: 14 (ref 5–15)
BUN: 59 mg/dL — AB (ref 6–20)
CO2: 17 mmol/L — ABNORMAL LOW (ref 22–32)
CREATININE: 3.45 mg/dL — AB (ref 0.44–1.00)
Calcium: 7.7 mg/dL — ABNORMAL LOW (ref 8.9–10.3)
Chloride: 99 mmol/L — ABNORMAL LOW (ref 101–111)
GFR calc Af Amer: 14 mL/min — ABNORMAL LOW (ref >60)
GFR calc non Af Amer: 12 mL/min — ABNORMAL LOW (ref >60)
Glucose, Bld: 188 mg/dL — ABNORMAL HIGH (ref 65–99)
Potassium: 4.2 mmol/L (ref 3.5–5.1)
SODIUM: 130 mmol/L — AB (ref 135–145)

## 2017-12-07 LAB — COMPREHENSIVE METABOLIC PANEL
ALBUMIN: 1.4 g/dL — AB (ref 3.5–5.0)
ALK PHOS: 99 U/L (ref 38–126)
ALT: 18 U/L (ref 14–54)
AST: 55 U/L — AB (ref 15–41)
Anion gap: 11 (ref 5–15)
BILIRUBIN TOTAL: 0.7 mg/dL (ref 0.3–1.2)
BUN: 53 mg/dL — AB (ref 6–20)
CALCIUM: 7.9 mg/dL — AB (ref 8.9–10.3)
CO2: 18 mmol/L — ABNORMAL LOW (ref 22–32)
CREATININE: 2.95 mg/dL — AB (ref 0.44–1.00)
Chloride: 102 mmol/L (ref 101–111)
GFR calc Af Amer: 17 mL/min — ABNORMAL LOW (ref >60)
GFR, EST NON AFRICAN AMERICAN: 14 mL/min — AB (ref >60)
GLUCOSE: 114 mg/dL — AB (ref 65–99)
Potassium: 4.3 mmol/L (ref 3.5–5.1)
Sodium: 131 mmol/L — ABNORMAL LOW (ref 135–145)
TOTAL PROTEIN: 4.4 g/dL — AB (ref 6.5–8.1)

## 2017-12-07 LAB — BLOOD GAS, ARTERIAL
Acid-base deficit: 10.8 mmol/L — ABNORMAL HIGH (ref 0.0–2.0)
Acid-base deficit: 10.2 mmol/L — ABNORMAL HIGH (ref 0.0–2.0)
Bicarbonate: 16 mmol/L — ABNORMAL LOW (ref 20.0–28.0)
Bicarbonate: 16.7 mmol/L — ABNORMAL LOW (ref 20.0–28.0)
Drawn by: 257701
FIO2: 60
FIO2: 60
MECHVT: 400 mL
MECHVT: 400 mL
O2 SAT: 84.5 %
O2 Saturation: 86.7 %
PATIENT TEMPERATURE: 100.4
PATIENT TEMPERATURE: 98.6
PEEP: 8 cmH2O
PEEP: 10 cmH2O
PO2 ART: 62 mmHg — AB (ref 83.0–108.0)
PO2 ART: 55 mmHg — AB (ref 83.0–108.0)
RATE: 20 resp/min
RATE: 20 resp/min
pCO2 arterial: 44.4 mmHg (ref 32.0–48.0)
pCO2 arterial: 43.9 mmHg (ref 32.0–48.0)
pH, Arterial: 7.19 — CL (ref 7.350–7.450)
pH, Arterial: 7.205 — ABNORMAL LOW (ref 7.350–7.450)

## 2017-12-07 LAB — CBC
HCT: 22.6 % — ABNORMAL LOW (ref 36.0–46.0)
Hemoglobin: 7.6 g/dL — ABNORMAL LOW (ref 12.0–15.0)
MCH: 28.7 pg (ref 26.0–34.0)
MCHC: 33.6 g/dL (ref 30.0–36.0)
MCV: 85.3 fL (ref 78.0–100.0)
PLATELETS: 224 10*3/uL (ref 150–400)
RBC: 2.65 MIL/uL — ABNORMAL LOW (ref 3.87–5.11)
RDW: 17.7 % — ABNORMAL HIGH (ref 11.5–15.5)
WBC: 38.5 10*3/uL — AB (ref 4.0–10.5)

## 2017-12-07 LAB — GLUCOSE, CAPILLARY
GLUCOSE-CAPILLARY: 141 mg/dL — AB (ref 65–99)
GLUCOSE-CAPILLARY: 193 mg/dL — AB (ref 65–99)
Glucose-Capillary: 84 mg/dL (ref 65–99)
Glucose-Capillary: 126 mg/dL — ABNORMAL HIGH (ref 65–99)
Glucose-Capillary: 178 mg/dL — ABNORMAL HIGH (ref 65–99)
Glucose-Capillary: 194 mg/dL — ABNORMAL HIGH (ref 65–99)

## 2017-12-07 LAB — PHOSPHORUS: PHOSPHORUS: 5.7 mg/dL — AB (ref 2.5–4.6)

## 2017-12-07 LAB — MAGNESIUM: Magnesium: 1.9 mg/dL (ref 1.7–2.4)

## 2017-12-07 LAB — PROCALCITONIN: PROCALCITONIN: 8.16 ng/mL

## 2017-12-07 MED ORDER — PIPERACILLIN-TAZOBACTAM IN DEX 2-0.25 GM/50ML IV SOLN
2.2500 g | Freq: Three times a day (TID) | INTRAVENOUS | Status: DC
  Administered 2017-12-07 – 2017-12-09 (×7): 2.25 g via INTRAVENOUS
  Filled 2017-12-07 (×9): qty 50

## 2017-12-07 MED ORDER — HYDROCORTISONE NA SUCCINATE PF 100 MG IJ SOLR
50.0000 mg | Freq: Four times a day (QID) | INTRAMUSCULAR | Status: DC
  Administered 2017-12-07 – 2017-12-08 (×4): 50 mg via INTRAVENOUS
  Filled 2017-12-07 (×4): qty 2

## 2017-12-07 MED ORDER — SODIUM BICARBONATE 8.4 % IV SOLN
INTRAVENOUS | Status: DC
  Administered 2017-12-07 – 2017-12-08 (×3): via INTRAVENOUS
  Filled 2017-12-07 (×5): qty 150

## 2017-12-07 MED ORDER — MIDAZOLAM HCL 2 MG/2ML IJ SOLN
INTRAMUSCULAR | Status: AC
  Filled 2017-12-07: qty 2

## 2017-12-07 NOTE — Progress Notes (Signed)
  Radiation Oncology         [phone]) [phone] ________________________________  Name: Carol Powers MRN: [id number]  Date: 12/07/2017  DOB: Jan 06, 1941  SIMULATION AND TREATMENT PLANNING NOTE  DIAGNOSIS:     ICD-10-CM   1. Malignant neoplasm of upper lobe of right lung [facility]) C34.11      Site:  chest  NARRATIVE:  The patient was brought to the [facility].  Identity was confirmed.  All relevant records and images related to the planned course of therapy were reviewed.   Written consent to proceed with treatment was confirmed which was freely given after reviewing the details related to the planned course of therapy had been reviewed with the patient.  Then, the patient was set-up in a stable reproducible  supine position for radiation therapy.  CT images were obtained.  Surface markings were placed.    The CT images were loaded into the planning software.  Then the target and avoidance structures were contoured.  Treatment planning then occurred.  The radiation prescription was entered and confirmed.  A total of 2 complex treatment devices were fabricated which relate to the designed radiation treatment fields. Each of these customized fields/ complex treatment devices will be used on a daily basis during the radiation course. I have requested : 3D Simulation  I have requested a DVH of the following structures: Target volume, spinal cord, lungs.   The patient will undergo daily image guidance to ensure accurate localization of the target, and adequate minimize dose to the normal surrounding structures in close proximity to the target.   PLAN:  The patient will receive 30 Gy in 10 fractions.  ________________________________   Jodelle Gross, MD, PhD

## 2017-12-07 NOTE — Progress Notes (Signed)
   12/07/17 0630  Vitals  Temp (!) 101.1 F (38.4 C)  Temp Source Bladder  Pulse Rate (!) 102  ECG Heart Rate (!) 102  Resp (!) 27  Oxygen Therapy  SpO2 93 %  Art Line  Arterial Line BP 106/47  Arterial Line MAP (mmHg) 62 mmHg  PRN tylenol at this time.

## 2017-12-07 NOTE — Progress Notes (Addendum)
[facility] Progress Note Patient Name: Carol Powers DOB: 05/29/1941 MRN: [id number]   Date of Service  12/07/2017  HPI/Events of Note  AKI with rising creatinine and oliguria-likely ATN.  If creatinine continues to rise may ultimately need CRRT.  eICU Interventions  Monitor urine output. Will give patient a 250 ml fluid bolus to test possibility of a pre-renal component.        Kerry Kass Lorrene Graef 12/07/2017, 11:22 PM

## 2017-12-07 NOTE — Progress Notes (Addendum)
PULMONARY / CRITICAL CARE MEDICINE   Name: Carol Powers MRN: [id number] DOB: 08/09/1941    ADMISSION DATE:  12/16/2017 CONSULTATION DATE:  12/14/2017  REFERRING MD:  Dr. Tyrone Nine / EDP   CHIEF COMPLAINT:  Hypotension   HISTORY OF PRESENT ILLNESS:   77 y/o F, former remote smoker (5 years, quit 50 years ago) who presented to [facility] ER on 3/6 after developing altered mental status at home with RUL post obstructive pneumonia   Hypotension responded to fluids and she was extubated 3/8 but required reintubation 3/11 for worsening right upper lobe collapse/hypoxia and respiratory acidosis    STUDIES:  3/4  CT Chest >> 6.1 cm posterior RUL mass concerning for primary bronchogenic neoplasm, mass extends to the right perihilar region and abuts the right major fissure.  Mediastinal & right hildar nodal metastases.  Bilateral adrenal masses measuring up to 5.2 cm on the R worrisome for metastatic disease.  Scattered hepatic lesions measuring up to 73m, indeterminate but worrisome for metastases.   Renal [place]3/6 >> normal renal [place] Head 3/6 >> mild chronic ischemic white matter disease. No acute abnormality.  Ct abd/pelvis 3/13: IMPRESSION: 1. Central mesenteric partially calcified mass is concerning for metastatic lesion. Lesion as typical appearance of carcinoid tumor however would favor lung carcinoma given biopsy-proven RIGHT upper lobe non-small cell lung cancer. 2. Bilateral large adrenal glands indeterminate and could represent metastatic lesions.3. LEFT retroperitoneal nodule. Recommend FDG PET scan for further evaluation4. Moderate volume of free fluid in the abdomen and pelvis.5. New bilateral pleural effusions and basilar atelectasis. RIGHT upper lobe mass is not imaged on current study.   CULTURES: BCx2 3/6 >> ng BC 3/10 MRSE 2/2  UA 3/6 >> concern for UTI  UC 3/6 >> greater 100k E-Coli >> pan sensitive   ANTIBIOTICS: Vanco 3/6 x1 Zosyn 3/6 x1 Rocephin (UTI) 3/7 >> 3/10 Azithro  (L infiltrate) 3/7 >> 3/10 Zosyn 3/10 >> vanc 3/10 >>  SIGNIFICANT EVENTS: 3/06  Admit, initially improved with IVF > decompensated late PM / intubated  3/07  Levophed 22 mcg, Vasopressin. CVP 10 3/8 Off pressors, extubated. 3/11 Worsened overnight with HFnC, then altered mental status this am requiring re-intubation by anesthesia  LINES/TUBES: ETT 3/6 >> 3/8, 3/11  >> Rt femoral TLC 3/7 >>3/9 A line 3/7 >>3/9 RIJ 3/11 >>  SUBJECTIVE:   Remains critically ill VITAL SIGNS: BP 113/87   Pulse (!) 102   Temp (!) 101.1 F (38.4 C)   Resp (!) 25   Ht _0  (1.549 m)   Wt 197 lb 8.5 oz (89.6 kg)   SpO2 (!) 89%   BMI 37.32 kg/m   HEMODYNAMICS: CVP:  [15 mmHg-68 mmHg] 68 mmHg  VENTILATOR SETTINGS: Vent Mode: PRVC FiO2 (%):  [40 %-100 %] 60 % Set Rate:  [20 bmp] 20 bmp Vt Set:  [400 mL] 400 mL PEEP:  [5 cmH20-8 cmH20] 8 cmH20 Plateau Pressure:  [19 cmH20-33 cmH20] 33 cmH20  INTAKE / OUTPUT:  Intake/Output Summary (Last 24 hours) at 12/07/2017 [id number] data filed at 12/07/2017 0636 Gross per 24 hour  Intake 4023.17 ml  Output 535 ml  Net 3488.17 ml     PHYSICAL EXAMINATION: General: 77year old critically ill female currently on full ventilator support unresponsive currently HEENT: Normocephalic atraumatic does have some scleral edema mucous membranes are moist orally intubated no jugular venous distention Pulmonary: Diffuse rhonchi decreased some mild right upper marked accessory use when turned on right side Cardiac: Regular rate Abdomen: Soft nontender  Extremities/muscular skeletal: Generalized weakness, worsening anasarca, brisk cap refill, strong pulses Neuro/psych: As mentioned unresponsive currently  LABS:  BMET Recent Labs  Lab 12/05/17 0600 12/06/17 0436 12/07/17 0531  NA 135 133* 131*  K 3.7 3.8 4.3  CL 107 106 102  CO2 20* 18* 18*  BUN 30* 41* 53*  CREATININE 1.43* 1.98* 2.95*  GLUCOSE 92 127* 114*    Electrolytes Recent Labs  Lab  12/04/17 0659 12/05/17 0600 12/06/17 0436 12/07/17 0531  CALCIUM 7.0* 7.9* 7.8* 7.9*  MG 1.3* 2.0  --  1.9  PHOS 1.9* 2.9  --  5.7*    CBC Recent Labs  Lab 12/05/17 0600 12/06/17 0436 12/07/17 0531  WBC 26.1* 29.4* 38.5*  HGB 9.0* 8.0* 7.6*  HCT 27.1* 24.2* 22.6*  PLT 232 222 224    Coag's No results for input(s): APTT, INR in the last 168 hours.  Sepsis Markers Recent Labs  Lab 12/04/17 0659 12/04/17 1110 12/06/17 0436 12/07/17 0531  LATICACIDVEN 2.3* 1.8  --   --   PROCALCITON 8.62  --  7.84 8.16    ABG Recent Labs  Lab 12/04/17 0528 12/04/17 1225 12/06/17 1058  PHART 7.275* 7.294* 7.262*  PCO2ART 51.7* 43.3 38.0  PO2ART 59.0* 126* 55.0*    Liver Enzymes Recent Labs  Lab 12/05/17 0600 12/07/17 0531  AST 68* 55*  ALT 21 18  ALKPHOS 84 99  BILITOT 0.6 0.7  ALBUMIN 1.5* 1.4*    Cardiac Enzymes Recent Labs  Lab 12/04/17 0659 12/04/17 1110 12/04/17 1815  TROPONINI 0.04* 0.03* 0.04*    Glucose Recent Labs  Lab 12/06/17 1138 12/06/17 1526 12/06/17 2023 12/06/17 2325 12/07/17 0322 12/07/17 0747  GLUCAP 129* 98 95 80 84 126*    Imaging Ct Abdomen Pelvis Wo Contrast  Result Date: 12/06/2017 CLINICAL DATA:  Lung cancer.  Recent diagnosis. EXAM: CT ABDOMEN AND PELVIS WITHOUT CONTRAST TECHNIQUE: Multidetector CT imaging of the abdomen and pelvis was performed following the standard protocol without IV contrast. COMPARISON:  Chest CT 11/27/2017 FINDINGS: Lower chest: New bilateral pleural effusion associated basilar atelectasis. The RIGHT upper lobe mass is not imaged. Hepatobiliary: No focal hepatic lesion on noncontrast exam. Postcholecystectomy. Pancreas: Pancreas is normal. No ductal dilatation. No pancreatic inflammation. Spleen: Bilateral enlargement of adrenal glands to 2.5 cm on RIGHT and 1.9 cm on LEFT. Kidneys normal. Ureters and bladder grossly normal Foley catheter within collapsed bladder Adrenals/urinary tract: Adrenal glands and  kidneys are normal. The ureters and bladder normal. Stomach/Bowel: NG tube in stomach. Mass within the central mesentery with calcifications. Mass is irregular measuring approximately 6.3 by 3.8 cm (image 48, series 2). There is fluid within the leaves of the mesentery. No bowel obstruction. Post appendectomy. Fluid stool in the colon. Vascular/Lymphatic: Abdominal aorta is normal caliber. No periportal or retroperitoneal adenopathy. No pelvic adenopathy. Reproductive: Uterus and ovaries normal Other: Nodule in the LEFT retroperitoneum adjacent to the psoas muscle measures 13 mm (image 50, series 2. Musculoskeletal: No aggressive osseous lesion. IMPRESSION: 1. Central mesenteric partially calcified mass is concerning for metastatic lesion. Lesion as typical appearance of carcinoid tumor however would favor lung carcinoma given biopsy-proven RIGHT upper lobe non-small cell lung cancer. 2. Bilateral large adrenal glands indeterminate and could represent metastatic lesions. 3. LEFT retroperitoneal nodule. Recommend FDG PET scan for further evaluation 4. Moderate volume of free fluid in the abdomen and pelvis. 5. New bilateral pleural effusions and basilar atelectasis. RIGHT upper lobe mass is not imaged on current study. Electronically Signed  By: Suzy Bouchard M.D.   On: 12/06/2017 18:39     DISCUSSION: 77 y/o F, former remote smoker (50 years ago), admitted with hypotension and altered mental status. Treated for e coli  UTI.   Recent discovery of lung mass with adrenal/ liver mets. Reintubated 3/11 for acute hyerpcarbic resp failure due to ? 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 Bibasilar effusions/vol loss -Rapidly decompensates when turned on the right side resulting in desaturation and hypotension Plan Cont full vent support  VAP interventions PEEP at 8 Repeat CXR and abg in am PAD protocol RASS goal 0  Cont BDs and HT saline neb NOT to turn on right  Starting XRT 3/14  Septic Shock - in setting of post obstructive PNA, pan sens E-Coli UTI & additionally hypovolemia (HCTZ), narcotics, poor PO intake contributing  -fever curve stable  -wbc pending -1/2 Coag neg SA from blood suspect contamination  -pressor requirements worse  Plan Holding ace I, diuretic and norvasc MAP > 65 CVP goal 8-12 Cont tele Day 4 vanc and 5 zosyn Ck cortisol and start stress dose steroids Trend PCT   AKI - suspect sepsis, hypovolemia, NSAIDS, hypotension, ACE-I-->worse  Metabolic acidosis (NAG)  Intermittent Fluid and electrolyte imbalance  Plan Repeat abg Change MIVF to bicarb gtt Serial chemistries  Hypoglycemia now resolved Plan Trend glucose   Anemia  S/p 1 U PRBC 3/11 -hgb w/ approp bump; no evidence of bleeding  Plan Trend cbc Transfuse for hgb < 7  Protein calorie malnutrition -moderate Diarrhea  Plan  Cont tubefeeds Adding flexiseal   Hypothyroidism Plan Cont synthroid     Acute encephalopathy c/b Pain - suspect secondary to metastatic disease   H/o Depression, anxiety Plan Rass goal 0 Supportive care   FAMILY  - Updates: Husband, daughter updated at bedside.   - Inter-disciplinary family meet or Palliative Care meeting due by:  3/12    DVT prophylaxis: [place] heparin  SUP: PPI  Diet: tubefeed Activity: BR Disposition : ICU critically ill  My cct 45 min  Erick Colace ACNP-BC [place] Pager # [phone] OR # [phone] if no answer   12/07/2017, 9:52 AM

## 2017-12-07 NOTE — Progress Notes (Signed)
Video bronchoscopy performed at the bedside.    Intervention bronchial biopsy.  No complications noted.  Samples sent to the lab.

## 2017-12-07 NOTE — Procedures (Signed)
Bronchoscopy Procedure Note REE ALCALDE [id number] April 10, 1941  Procedure: Bronchoscopy Indications: Obtain specimens for culture and/or other diagnostic studies  Procedure Details Consent: Risks of procedure as well as the alternatives and risks of each were explained to the (patient/caregiver).  Consent for procedure obtained. Time Out: Verified patient identification, verified procedure, site/side was marked, verified correct patient position, special equipment/implants available, medications/allergies/relevent history reviewed, required imaging and test results available.  Performed  In preparation for procedure, patient was given 100% FiO2. Sedation: fent 100 mcg  Airway entered and the following bronchi were examined: Bronchi.   Procedures performed: endobronchial Bx performed Bronchoscope removed.  , Patient placed back on 100% FiO2 at conclusion of procedure.    Evaluation Hemodynamic Status: Transient hypotension treated with pressors; O2 sats: stable throughout Patient's Current Condition: stable Specimens:   Bx for path for EGFR & foundation 1 markers per oncology  Complications: No apparent complications Patient did tolerate procedure well.   Leanna Sato [place] 12/07/2017

## 2017-12-07 NOTE — Progress Notes (Signed)
Pharmacy Antibiotic Note  Carol Powers is a 77 y.o. female admitted on 12/17/2017 with weakness, AMS, and recent (11/27/17) CT with  RUL mass and adrenal masses concerning for metastatic lung cancer.  She was initially on Ceftriaxone and Doxycyline for CAP and UTI.  Pharmacy consulted for Zosyn dosing for sepsis pm 3/10 and consulted for vancomycin on 3/11, postobstructive pna, ongoing leukocytosis, and fever despite current abx.  Today, 12/07/2017: Day #5 Zosyn Day #4 Vancomycin SCr continues to increase with marginal UOP Remains febrile procalcitonin 7.84 --> 8.16 WBC increasing  Plan:  Adjust zosyn to 2.25gm IV q8h for worsening renal fx  Hold vancomycin to worsening renal fx, check random vancomycin level in am   Follow up renal fxn, culture results, and clinical course.  Height: _0  (154.9 cm) Weight: 197 lb 8.5 oz (89.6 kg) IBW/kg (Calculated) : 47.8  Temp (24hrs), Avg:100.7 F (38.2 C), Min:100 F (37.8 C), Max:101.5 F (38.6 C)  Recent Labs  Lab 12/03/17 0434 12/03/17 0829 12/04/17 0659 12/04/17 1110 12/05/17 0600 12/06/17 0436 12/07/17 0531  WBC  --  16.6* 19.2*  --  26.1* 29.4* 38.5*  CREATININE 0.83  --  1.04*  --  1.43* 1.98* 2.95*  LATICACIDVEN  --   --  2.3* 1.8  --   --   --     Estimated Creatinine Clearance: 16.5 mL/min (A) (by C-G formula based on SCr of 2.95 mg/dL (H)).    Allergies  Allergen Reactions  . Beta Adrenergic Blockers Other (See Comments)    Nightmares  . Codeine Nausea And Vomiting    severe  . Dexilant [Dexlansoprazole]     Rapid heart rate   . Erythromycin     Messed liver up  . Septra [Sulfamethoxazole-Trimethoprim]     Blisters     Antimicrobials this admission:  3/6 vanc/zosyn x1 3/6 CTX>> 3/10 3/6 Doxy >> 3/10 3/10 Zosyn >>  3/11 vanc>>  Dose adjustments this admission:  3/12 adjust vanc from 730m q24h to q48h for SCr rise to 1.43, AUC 398 (did not get a q24h dose)  Microbiology results:  3/6 BCx x2:  NGF 3/6 UCx: > 100k Ecoli (pan-sens) 3/6 MRSA PCR: neg 3/6 ur strep pneu: neg 3/6 legionella: neg 3/6 UA: many bacteria, large leuk 3/7 Resp PCR panel: none detected 3/10 BCx x2: 1/2 MS-CoNS (BCID= staph species) - suspected contaminant 3/11 BAL: no organisms see, re-incubated 3/11 BAL (AFB): 3/11 BAL (fungal):  Thank you for allowing pharmacy to be a part of this patient's care.  DDoreene Eland PharmD, BCPS.   Pager: [phone]/14/2019 7:14 AM

## 2017-12-08 ENCOUNTER — Ambulatory Visit: Payer: Medicare Other

## 2017-12-08 DIAGNOSIS — N179 Acute kidney failure, unspecified: Secondary | ICD-10-CM

## 2017-12-08 LAB — GLUCOSE, CAPILLARY
GLUCOSE-CAPILLARY: 151 mg/dL — AB (ref 65–99)
GLUCOSE-CAPILLARY: 183 mg/dL — AB (ref 65–99)
Glucose-Capillary: 188 mg/dL — ABNORMAL HIGH (ref 65–99)
Glucose-Capillary: 189 mg/dL — ABNORMAL HIGH (ref 65–99)
Glucose-Capillary: 171 mg/dL — ABNORMAL HIGH (ref 65–99)
Glucose-Capillary: 151 mg/dL — ABNORMAL HIGH (ref 65–99)

## 2017-12-08 LAB — BLOOD GAS, ARTERIAL
ACID-BASE DEFICIT: 9.4 mmol/L — AB (ref 0.0–2.0)
Acid-base deficit: 6.3 mmol/L — ABNORMAL HIGH (ref 0.0–2.0)
BICARBONATE: 17.6 mmol/L — AB (ref 20.0–28.0)
Bicarbonate: 19.1 mmol/L — ABNORMAL LOW (ref 20.0–28.0)
DRAWN BY: 441261
Drawn by: 235321
FIO2: 60
FIO2: 70
LHR: 20 {breaths}/min
MECHVT: 400 mL
MECHVT: 400 mL
O2 SAT: 82.3 %
O2 SAT: 90.5 %
PATIENT TEMPERATURE: 37.2
PATIENT TEMPERATURE: 37
PCO2 ART: 40.6 mmHg (ref 32.0–48.0)
PEEP/CPAP: 10 cmH2O
PEEP/CPAP: 10 cmH2O
PH ART: 7.196 — AB (ref 7.350–7.450)
PH ART: 7.294 — AB (ref 7.350–7.450)
PO2 ART: 64.6 mmHg — AB (ref 83.0–108.0)
RATE: 22 resp/min
pCO2 arterial: 47.3 mmHg (ref 32.0–48.0)
pO2, Arterial: 54.1 mmHg — ABNORMAL LOW (ref 83.0–108.0)

## 2017-12-08 LAB — CULTURE, BLOOD (ROUTINE X 2)
CULTURE: NO GROWTH
SPECIAL REQUESTS: ADEQUATE

## 2017-12-08 LAB — MISC LABCORP TEST (SEND OUT): LABCORP TEST CODE: 489067

## 2017-12-08 LAB — PHOSPHORUS: Phosphorus: 5.6 mg/dL — ABNORMAL HIGH (ref 2.5–4.6)

## 2017-12-08 LAB — BASIC METABOLIC PANEL
ANION GAP: 15 (ref 5–15)
ANION GAP: 14 (ref 5–15)
BUN: 62 mg/dL — ABNORMAL HIGH (ref 6–20)
BUN: 66 mg/dL — AB (ref 6–20)
CHLORIDE: 97 mmol/L — AB (ref 101–111)
CHLORIDE: 95 mmol/L — AB (ref 101–111)
CO2: 18 mmol/L — AB (ref 22–32)
CO2: 20 mmol/L — AB (ref 22–32)
CREATININE: 3.47 mg/dL — AB (ref 0.44–1.00)
Calcium: 7.5 mg/dL — ABNORMAL LOW (ref 8.9–10.3)
Calcium: 7.1 mg/dL — ABNORMAL LOW (ref 8.9–10.3)
Creatinine, Ser: 3.5 mg/dL — ABNORMAL HIGH (ref 0.44–1.00)
GFR calc Af Amer: 14 mL/min — ABNORMAL LOW (ref >60)
GFR calc non Af Amer: 12 mL/min — ABNORMAL LOW (ref >60)
GFR calc non Af Amer: 12 mL/min — ABNORMAL LOW (ref >60)
GFR, EST AFRICAN AMERICAN: 14 mL/min — AB (ref >60)
GLUCOSE: 197 mg/dL — AB (ref 65–99)
Glucose, Bld: 206 mg/dL — ABNORMAL HIGH (ref 65–99)
POTASSIUM: 4 mmol/L (ref 3.5–5.1)
Potassium: 3.2 mmol/L — ABNORMAL LOW (ref 3.5–5.1)
Sodium: 130 mmol/L — ABNORMAL LOW (ref 135–145)
Sodium: 129 mmol/L — ABNORMAL LOW (ref 135–145)

## 2017-12-08 LAB — CBC
HEMATOCRIT: 21.7 % — AB (ref 36.0–46.0)
HEMOGLOBIN: 7.3 g/dL — AB (ref 12.0–15.0)
MCH: 28.2 pg (ref 26.0–34.0)
MCHC: 33.6 g/dL (ref 30.0–36.0)
MCV: 83.8 fL (ref 78.0–100.0)
Platelets: 228 10*3/uL (ref 150–400)
RBC: 2.59 MIL/uL — ABNORMAL LOW (ref 3.87–5.11)
RDW: 17.5 % — ABNORMAL HIGH (ref 11.5–15.5)
WBC: 45 10*3/uL — ABNORMAL HIGH (ref 4.0–10.5)

## 2017-12-08 LAB — VANCOMYCIN, RANDOM: Vancomycin Rm: 16

## 2017-12-08 LAB — CULTURE, BAL-QUANTITATIVE W GRAM STAIN: Culture: 10000 — AB

## 2017-12-08 LAB — C DIFFICILE QUICK SCREEN W PCR REFLEX
C Diff antigen: NEGATIVE
C Diff interpretation: NOT DETECTED
C Diff toxin: NEGATIVE

## 2017-12-08 LAB — CORTISOL: CORTISOL PLASMA: 97.8 ug/dL

## 2017-12-08 LAB — MAGNESIUM: Magnesium: 1.8 mg/dL (ref 1.7–2.4)

## 2017-12-08 LAB — PROCALCITONIN: Procalcitonin: 7.28 ng/mL

## 2017-12-08 LAB — CULTURE, BAL-QUANTITATIVE

## 2017-12-08 MED ORDER — PRO-STAT SUGAR FREE PO LIQD
30.0000 mL | Freq: Three times a day (TID) | ORAL | Status: DC
  Administered 2017-12-08 – 2017-12-09 (×4): 30 mL
  Filled 2017-12-08 (×3): qty 30

## 2017-12-08 MED ORDER — FUROSEMIDE 10 MG/ML IJ SOLN
120.0000 mg | Freq: Two times a day (BID) | INTRAVENOUS | Status: DC
  Administered 2017-12-08 – 2017-12-09 (×3): 120 mg via INTRAVENOUS
  Filled 2017-12-08: qty 10
  Filled 2017-12-08: qty 12
  Filled 2017-12-08 (×2): qty 10

## 2017-12-08 MED ORDER — VANCOMYCIN HCL IN DEXTROSE 750-5 MG/150ML-% IV SOLN
750.0000 mg | Freq: Once | INTRAVENOUS | Status: DC
  Filled 2017-12-08: qty 150

## 2017-12-08 MED ORDER — SODIUM CHLORIDE 0.9 % IV SOLN
Freq: Once | INTRAVENOUS | Status: AC
  Administered 2017-12-08: 01:00:00 via INTRAVENOUS

## 2017-12-08 MED ORDER — NEPRO/CARBSTEADY PO LIQD
1000.0000 mL | ORAL | Status: DC
  Administered 2017-12-08 – 2017-12-09 (×2): 1000 mL via ORAL
  Filled 2017-12-08 (×2): qty 1000

## 2017-12-08 NOTE — Progress Notes (Signed)
Nutrition Follow-up  DOCUMENTATION CODES:   Not applicable  INTERVENTION:  - Due to elevated Phos with no plan for dialysis, will change TF regimen: Nepro @ 30 mL/hr with 30 mL Prostat TID. This regimen will provide 1596 kcal, 103 grams of protein, 668 mg Phos, and 523 mL free water. - Will continue to monitor labs closely.   NUTRITION DIAGNOSIS:   Inadequate oral intake related to inability to eat as evidenced by NPO status. -ongoing  GOAL:   Patient will meet greater than or equal to 90% of their needs -met with TF regimen  MONITOR:   TF tolerance, Vent status, Weight trends, Labs  ASSESSMENT:   77-year-old former smoker with new diagnosis of right upper lobe mass concerning for cancer. Admitted with altered mental status, AKI, hypotension, likely sepsis from UTI, elevated lactic acid.  Pt was extubated on 3/8 and subsequently re-intubated on 3/11 d/t RUL collapseand OGT replaced on that date. Pt remains intubated with OGT in place and is receiving Vital AF 1.2 @ 55 mL/hr which provides 1584 kcal, 99 grams of protein (98% re-estimated protein need), 1070 mL free water.   Per [facility], PCCM NP, note this AM: very deconditioned with severe anasarca, decreased R breath sounds and desats quickly if rolled onto R side, mild abdominal distention, worsening metabolic acidosis, septic shock d/t postobstructive PNA, AKI and not a candidate for dialysis. Pt is now DNR.  Patient is currently intubated on ventilator support MV: 9 L/min Temp (24hrs), Avg:99.2 F (37.3 C), Min:98.1 F (36.7 C), Max:100.4 F (38 C)   Medications reviewed; 120 mg IV Lasix BID, sliding scale Novolog, 100 mcg Synthroid per OGT/day, 40 mg IV Protonix/day, 1 packet Miralax/day Labs reviewed; CBGs: 188 and 189 mg/dL, Na: 130 mmol/L, Cl: 97 mmol/L, BUN: 62 mg/dL, creatinine: 3.47 mg/dL, Ca: 7.5 mg/dL, Phos: 5.6 mg/dL, GFR: 12 mL/min.  IVF: D5-150 mEq sodium bicarb @ 100 mL/hr (408 kcal). Drip: Levo @ 4  mcg/min.    Diet Order:  Diet NPO time specified  EDUCATION NEEDS:   No education needs have been identified at this time  Skin:  Skin Assessment: Reviewed RN Assessment  Last BM:  3/14  Height:   Ht Readings from Last 1 Encounters:  11/27/2017 5' 1" (1.549 m)    Weight:   Wt Readings from Last 1 Encounters:  12/08/17 202 lb 6.1 oz (91.8 kg)    Ideal Body Weight:  47.73 kg  BMI:  Body mass index is 38.24 kg/m.  Estimated Nutritional Needs:   Kcal:  1535  Protein:  101-115 grams (1.4-1.6 grams/kg)  Fluid:  2L/day       , MS, RD, LDN, CNSC Inpatient Clinical Dietitian Pager # [phone] After hours/weekend pager # [phone]  

## 2017-12-08 NOTE — Progress Notes (Signed)
Pharmacy Antibiotic Note  Carol Powers is a 77 y.o. female admitted on 12/19/2017 with weakness, AMS, and recent (11/27/17) CT with  RUL mass and adrenal masses concerning for metastatic lung cancer.  She was initially on Ceftriaxone and Doxycyline for CAP and UTI.  Pharmacy consulted for Zosyn dosing for sepsis pm 3/10 and consulted for vancomycin on 3/11, postobstructive pna, ongoing leukocytosis, and fever despite current abx.  Today, 12/08/2017: Day #6 Zosyn Day #5 Vancomycin SCr continues to increase with marginal UOP Remains febrile procalcitonin 7.84 --> 8.16-->7.28 WBC increasing  Plan:  Continue Zosyn 2.25gm IV q8h (CrCl<20)  Re-dose Vancomycin 7110m IV x1 today, check random vancomycin level ~48hrs   Follow up renal fxn, culture results, and clinical course.  Height: _0  (154.9 cm) Weight: 202 lb 6.1 oz (91.8 kg) IBW/kg (Calculated) : 47.8  Temp (24hrs), Avg:99.7 F (37.6 C), Min:98.8 F (37.1 C), Max:100.9 F (38.3 C)  Recent Labs  Lab 12/04/17 0659 12/04/17 1110 12/05/17 0600 12/06/17 0436 12/07/17 0531 12/07/17 2141 12/08/17 0459  WBC 19.2*  --  26.1* 29.4* 38.5*  --  45.0*  CREATININE 1.04*  --  1.43* 1.98* 2.95* 3.45* 3.47*  LATICACIDVEN 2.3* 1.8  --   --   --   --   --   VANCORANDOM  --   --   --   --   --   --  16    Estimated Creatinine Clearance: 14.2 mL/min (A) (by C-G formula based on SCr of 3.47 mg/dL (H)).    Allergies  Allergen Reactions  . Beta Adrenergic Blockers Other (See Comments)    Nightmares  . Codeine Nausea And Vomiting    severe  . Dexilant [Dexlansoprazole]     Rapid heart rate   . Erythromycin     Messed liver up  . Septra [Sulfamethoxazole-Trimethoprim]     Blisters     Antimicrobials this admission:  3/6 vanc/zosyn x1 3/6 CTX>> 3/10 3/6 Doxy >> 3/10 3/10 Zosyn >>  3/11 vanc>>  Dose adjustments this admission:  3/12 adjust vanc from 7512mq24h to q48h for SCr rise to 1.43, AUC 398 (did not get a q24h  dose) 3/15 RVL _1 =16 after 75035mn 3/13@ @ 0925  Microbiology results:  3/6 BCx x2: NGF 3/6 UCx: > 100k Ecoli (pan-sens) 3/6 MRSA PCR: neg 3/6 ur strep pneu: neg 3/6 legionella: neg 3/6 UA: many bacteria, large leuk 3/7 Resp PCR panel: none detected 3/10 BCx x2: 1/2 MS-CoNS (BCID= staph species) - suspected contaminant 3/11 BAL: no organisms see, re-incubated 3/11 BAL (AFB): neg 3/11 BAL (fungal): +Candida  Thank you for allowing pharmacy to be a part of this patient's Carol.  Carol Powers, BCPS Pager: [phone]/2019 7:22 AM

## 2017-12-08 NOTE — Progress Notes (Signed)
PULMONARY / CRITICAL CARE MEDICINE   Name: Carol Powers MRN: [id number] DOB: 10/14/1940    ADMISSION DATE:  12/03/2017 CONSULTATION DATE:  11/30/2017  REFERRING MD:  Dr. Tyrone Nine / EDP   CHIEF COMPLAINT:  Hypotension   HISTORY OF PRESENT ILLNESS:   77 y/o F, former remote smoker (5 years, quit 50 years ago) who presented to [facility] ER on 3/6 after developing altered mental status at home with RUL post obstructive pneumonia   Hypotension responded to fluids and she was extubated 3/8 but required reintubation 3/11 for worsening right upper lobe collapse/hypoxia and respiratory acidosis    STUDIES:  3/4  CT Chest >> 6.1 cm posterior RUL mass concerning for primary bronchogenic neoplasm, mass extends to the right perihilar region and abuts the right major fissure.  Mediastinal & right hildar nodal metastases.  Bilateral adrenal masses measuring up to 5.2 cm on the R worrisome for metastatic disease.  Scattered hepatic lesions measuring up to 84m, indeterminate but worrisome for metastases.   Renal [place]3/6 >> normal renal [place] Head 3/6 >> mild chronic ischemic white matter disease. No acute abnormality.  Ct abd/pelvis 3/13: IMPRESSION: 1. Central mesenteric partially calcified mass is concerning for metastatic lesion. Lesion as typical appearance of carcinoid tumor however would favor lung carcinoma given biopsy-proven RIGHT upper lobe non-small cell lung cancer. 2. Bilateral large adrenal glands indeterminate and could represent metastatic lesions.3. LEFT retroperitoneal nodule. Recommend FDG PET scan for further evaluation4. Moderate volume of free fluid in the abdomen and pelvis.5. New bilateral pleural effusions and basilar atelectasis. RIGHT upper lobe mass is not imaged on current study.   CULTURES: BCx2 3/6 >> ng BC 3/10 MRSE 2/2  UA 3/6 >> concern for UTI  UC 3/6 >> greater 100k E-Coli >> pan sensitive   ANTIBIOTICS: Vanco 3/6 x1 Zosyn 3/6 x1 Rocephin (UTI) 3/7 >> 3/10 Azithro  (L infiltrate) 3/7 >> 3/10 Zosyn 3/10 >> vanc 3/10 >>3/15  SIGNIFICANT EVENTS: 3/06  Admit, initially improved with IVF > decompensated late PM / intubated  3/07  Levophed 22 mcg, Vasopressin. CVP 10 3/8 Off pressors, extubated. 3/11 Worsened overnight with HFnC, then altered mental status this am requiring re-intubation by anesthesia  LINES/TUBES: ETT 3/6 >> 3/8, 3/11  >> Rt femoral TLC 3/7 >>3/9 A line 3/7 >>3/9 RIJ 3/11 >>  SUBJECTIVE:    Unresponsive on the ventilator  VITAL SIGNS: BP (!) 122/55   Pulse 86   Temp 98.6 F (37 C)   Resp (!) 24   Ht _0  (1.549 m)   Wt 202 lb 6.1 oz (91.8 kg)   SpO2 94%   BMI 38.24 kg/m    HEMODYNAMICS: CVP:  [16 mmHg-18 mmHg] 18 mmHg  VENTILATOR SETTINGS: Vent Mode: PRVC FiO2 (%):  [60 %-100 %] 70 % Set Rate:  [20 bmp-22 bmp] 22 bmp Vt Set:  [400 mL] 400 mL PEEP:  [10 cmH20] 10 cmH20 Delta P (Amplitude):  [26] 26 Plateau Pressure:  [18 cmH20-24 cmH20] 24 cmH20  INTAKE / OUTPUT:  Intake/Output Summary (Last 24 hours) at 12/08/2017 [id number] data filed at 12/08/2017 0600 Gross per 24 hour  Intake 3846.24 ml  Output 390 ml  Net 3456.24 ml     PHYSICAL EXAMINATION:  General: This is 77year old critically ill female is currently unresponsive on the vent HEENT: Normocephalic atraumatic.  She does have scleral edema, she is orally intubated, mucous membranes are moist. Pulmonary: Scattered coarse rhonchi no accessory use Cardiac: Regular rate and rhythm Extremities/musculoskeletal:  Diffuse anasarca, warm, dry, intact pulses Abdomen: Distended, firm, copious diarrhea draining from rectal tube Neuro/psych: Unresponsive  LABS:  BMET Recent Labs  Lab 12/07/17 0531 12/07/17 2141 12/08/17 0459  NA 131* 130* 130*  K 4.3 4.2 4.0  CL 102 99* 97*  CO2 18* 17* 18*  BUN 53* 59* 62*  CREATININE 2.95* 3.45* 3.47*  GLUCOSE 114* 188* 206*    Electrolytes Recent Labs  Lab 12/05/17 0600  12/07/17 0531 12/07/17 2141  12/08/17 0459  CALCIUM 7.9*   < > 7.9* 7.7* 7.5*  MG 2.0  --  1.9  --  1.8  PHOS 2.9  --  5.7*  --  5.6*   < > = values in this interval not displayed.    CBC Recent Labs  Lab 12/06/17 0436 12/07/17 0531 12/08/17 0459  WBC 29.4* 38.5* 45.0*  HGB 8.0* 7.6* 7.3*  HCT 24.2* 22.6* 21.7*  PLT 222 224 228    Coag's No results for input(s): APTT, INR in the last 168 hours.  Sepsis Markers Recent Labs  Lab 12/04/17 0659 12/04/17 1110 12/06/17 0436 12/07/17 0531 12/08/17 0459  LATICACIDVEN 2.3* 1.8  --   --   --   PROCALCITON 8.62  --  7.84 8.16 7.28    ABG Recent Labs  Lab 12/07/17 1025 12/07/17 1820 12/08/17 0414  PHART 7.190* 7.205* 7.196*  PCO2ART 44.4 43.9 47.3  PO2ART 62.0* 55.0* 54.1*    Liver Enzymes Recent Labs  Lab 12/05/17 0600 12/07/17 0531  AST 68* 55*  ALT 21 18  ALKPHOS 84 99  BILITOT 0.6 0.7  ALBUMIN 1.5* 1.4*    Cardiac Enzymes Recent Labs  Lab 12/04/17 0659 12/04/17 1110 12/04/17 1815  TROPONINI 0.04* 0.03* 0.04*    Glucose Recent Labs  Lab 12/07/17 1133 12/07/17 1518 12/07/17 1943 12/07/17 2352 12/08/17 0329 12/08/17 0752  GLUCAP 141* 178* 194* 193* 188* 189*    Imaging No results found.   DISCUSSION: 77 y/o F, former remote smoker (50 years ago), admitted with hypotension and altered mental status. Treated for e coli  UTI.   Recent discovery of lung mass with adrenal/ liver mets. Reintubated 3/11 for acute hyerpcarbic resp failure due to ? 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 Bibasilar effusions/vol loss -Rapidly decompensates when turned on the right side resulting in desaturation and hypotension Plan Full ventilator support Avoid turning on right side Continue radiation therapy in hopes of recruiting right lung Repeat chest x-ray a.m. VAP prevention PAD protocol for RASS of 0 Continue bronchodilators as well as hypertonic saline nebulizers  Septic Shock - in setting of post obstructive PNA, pan sens E-Coli UTI & additionally hypovolemia (HCTZ), narcotics, poor PO intake contributing  -fever curve stable  -wbc pending -1/2 Coag neg SA from blood suspect contamination  -pressor requirements worse  Plan Holding antihypertensives and diuretics Continue Levophed infusion for map greater than 65 Continue telemetry Day #5 vancomycin and 6 Zosyn, she Grown any methicillin-resistant so we will discontinue vancomycin  AKI - suspect sepsis, hypovolemia, NSAIDS, hypotension, ACE-I-->worse  Metabolic acidosis (  NAG)  Intermittent Fluid and electrolyte imbalance  Plan Continue bicarb drip Lasix 120 mg IV every 12 Long discussion with husband and granddaughter they are in agreement with no dialysis  Hypoglycemia now resolved Plan Sliding scale insulin  Anemia  S/p 1 U PRBC 3/11 -hgb w/ approp bump; no evidence of bleeding  Plan Trend CBC transfuse for hemoglobin less than 7  Protein calorie malnutrition -moderate Diarrhea  Plan  Cont flexi-seal  Ck cdiff   Hypothyroidism Plan Continue Synthroid    Acute encephalopathy c/b Pain - suspect secondary to metastatic disease   H/o Depression, anxiety I think this is primarily a complication of her renal failure Plan Continuing supportive care  FAMILY  - Updates: Husband, daughter updated at bedside.   - Inter-disciplinary family meet or Palliative Care meeting due by:  3/12; spoke with husband at length in regards to goals of care, declining clinical status, and limitations.   She has progressive renal failure, she is comatose, and still in shock.  At this point she is now DO NOT RESUSCITATE, furthermore we will not offer dialysis should her renal function continued to decline.  I have advised her husband to prepare for the fact that she is unlikely to survive this    DVT prophylaxis: [place] heparin  SUP: PPI  Diet: tubefeed Activity: BR Disposition : ICU critically ill  My cct 45 min  Erick Colace ACNP-BC [place] Pager # [phone] OR # [phone] if no answer   12/08/2017, 8:33 AM

## 2017-12-08 NOTE — Progress Notes (Signed)
Scheduled 1800 ABG held per [facility] NP. ABG drawn at earlier time; will resume ABG at 0600 23-Dec-2017. RT will continue to monitor patient.

## 2017-12-08 NOTE — Progress Notes (Signed)
[place] Progress Note Patient Name: Carol Powers DOB: 1941-04-19 MRN: [id number]   Date of Service  12/08/2017  HPI/Events of Note  Slightly worse metabolic acidosis. Mild resp acidosis my also be a contributing factor to the low PH. Pt remains oliguric.  eICU Interventions  Increase bicarb infusion to 100 ml/ hr Increase resp rate to 22 Overall prognosis is poor        Frederik Pear 12/08/2017, 4:36 AM

## 2017-12-08 NOTE — Progress Notes (Signed)
Machine called notifying this Probation officer that the patient was unable to come down for treatment and stated the floor RN would like for this writer to call.  Spoke with floor RN and she stated that the patient was not stable enough to come down for radiation treatment today.  Will continue to follow as necessary.  Cori Razor, RN

## 2017-12-09 ENCOUNTER — Inpatient Hospital Stay (HOSPITAL_COMMUNITY): Payer: Medicare Other

## 2017-12-09 LAB — BLOOD GAS, ARTERIAL
Acid-base deficit: 2.8 mmol/L — ABNORMAL HIGH (ref 0.0–2.0)
Acid-base deficit: 5.6 mmol/L — ABNORMAL HIGH (ref 0.0–2.0)
BICARBONATE: 22.3 mmol/L (ref 20.0–28.0)
Bicarbonate: 21.7 mmol/L (ref 20.0–28.0)
DRAWN BY: 441261
Drawn by: 235321
FIO2: 65
FIO2: 100
LHR: 22 {breaths}/min
MECHVT: 400 mL
O2 SAT: 89.4 %
O2 SAT: 91.3 %
PATIENT TEMPERATURE: 37
PCO2 ART: 43.3 mmHg (ref 32.0–48.0)
PCO2 ART: 57.7 mmHg — AB (ref 32.0–48.0)
PEEP/CPAP: 10 cmH2O
PEEP: 12 cmH2O
PH ART: 7.331 — AB (ref 7.350–7.450)
Patient temperature: 98.6
RATE: 22 resp/min
VT: 400 mL
pH, Arterial: 7.201 — ABNORMAL LOW (ref 7.350–7.450)
pO2, Arterial: 62.4 mmHg — ABNORMAL LOW (ref 83.0–108.0)
pO2, Arterial: 75.5 mmHg — ABNORMAL LOW (ref 83.0–108.0)

## 2017-12-09 LAB — BASIC METABOLIC PANEL
ANION GAP: 15 (ref 5–15)
Anion gap: 15 (ref 5–15)
BUN: 84 mg/dL — ABNORMAL HIGH (ref 6–20)
BUN: 76 mg/dL — AB (ref 6–20)
CALCIUM: 7.3 mg/dL — AB (ref 8.9–10.3)
CALCIUM: 7.3 mg/dL — AB (ref 8.9–10.3)
CO2: 22 mmol/L (ref 22–32)
CO2: 23 mmol/L (ref 22–32)
Chloride: 93 mmol/L — ABNORMAL LOW (ref 101–111)
Chloride: 92 mmol/L — ABNORMAL LOW (ref 101–111)
Creatinine, Ser: 3.75 mg/dL — ABNORMAL HIGH (ref 0.44–1.00)
Creatinine, Ser: 4.11 mg/dL — ABNORMAL HIGH (ref 0.44–1.00)
GFR calc Af Amer: 13 mL/min — ABNORMAL LOW (ref >60)
GFR calc non Af Amer: 11 mL/min — ABNORMAL LOW (ref >60)
GFR, EST AFRICAN AMERICAN: 11 mL/min — AB (ref >60)
GFR, EST NON AFRICAN AMERICAN: 10 mL/min — AB (ref >60)
GLUCOSE: 146 mg/dL — AB (ref 65–99)
Glucose, Bld: 103 mg/dL — ABNORMAL HIGH (ref 65–99)
Potassium: 3.1 mmol/L — ABNORMAL LOW (ref 3.5–5.1)
Potassium: 3.4 mmol/L — ABNORMAL LOW (ref 3.5–5.1)
Sodium: 130 mmol/L — ABNORMAL LOW (ref 135–145)
Sodium: 130 mmol/L — ABNORMAL LOW (ref 135–145)

## 2017-12-09 LAB — CBC
HCT: 20 % — ABNORMAL LOW (ref 36.0–46.0)
HEMOGLOBIN: 6.7 g/dL — AB (ref 12.0–15.0)
MCH: 27.9 pg (ref 26.0–34.0)
MCHC: 33.5 g/dL (ref 30.0–36.0)
MCV: 83.3 fL (ref 78.0–100.0)
Platelets: 222 10*3/uL (ref 150–400)
RBC: 2.4 MIL/uL — AB (ref 3.87–5.11)
RDW: 17.2 % — ABNORMAL HIGH (ref 11.5–15.5)
WBC: 32.2 10*3/uL — ABNORMAL HIGH (ref 4.0–10.5)

## 2017-12-09 LAB — GLUCOSE, CAPILLARY
GLUCOSE-CAPILLARY: 140 mg/dL — AB (ref 65–99)
GLUCOSE-CAPILLARY: 85 mg/dL (ref 65–99)
GLUCOSE-CAPILLARY: 40 mg/dL — AB (ref 65–99)
Glucose-Capillary: 126 mg/dL — ABNORMAL HIGH (ref 65–99)
Glucose-Capillary: 81 mg/dL (ref 65–99)

## 2017-12-09 MED ORDER — SODIUM CHLORIDE 0.9 % IV SOLN: Freq: Once | INTRAVENOUS | Status: DC

## 2017-12-09 MED ORDER — DEXTROSE 50 % IV SOLN
INTRAVENOUS | Status: AC
  Administered 2017-12-09: 50 mL
  Filled 2017-12-09: qty 50

## 2017-12-09 MED ORDER — ALTEPLASE 2 MG IJ SOLR
2.0000 mg | Freq: Once | INTRAMUSCULAR | Status: AC
  Administered 2017-12-09: 2 mg
  Filled 2017-12-09: qty 2

## 2017-12-09 MED ORDER — POTASSIUM CHLORIDE 20 MEQ/15ML (10%) PO SOLN
40.0000 meq | Freq: Once | ORAL | Status: AC
  Administered 2017-12-09: 40 meq
  Filled 2017-12-09: qty 30

## 2017-12-10 LAB — TYPE AND SCREEN
ABO/RH(D): A POS
Antibody Screen: NEGATIVE
Unit division: 0

## 2017-12-10 LAB — BPAM RBC
BLOOD PRODUCT EXPIRATION DATE: 201903312359
ISSUE DATE / TIME: 201903161334
Unit Type and Rh: 6200

## 2017-12-11 ENCOUNTER — Ambulatory Visit: Payer: Medicare Other

## 2017-12-11 ENCOUNTER — Telehealth: Payer: Self-pay

## 2017-12-11 ENCOUNTER — Encounter: Payer: Self-pay | Admitting: Radiation Oncology

## 2017-12-11 NOTE — Telephone Encounter (Signed)
On 12/11/17 I received a d/c from [facility] (original). The d/c is for burial. The patient is a patient of Doctor McQuaid. The d/c will be taken to Pulmonary Unit @ [facility] for signature.  On 12/13/17 I received the d/c back from Doctor McQuaid. I got the d/c ready and called the funeral home to let them know the d/c is ready for pickup.  I also faxed a copy to the funeral home per the funeral home request.

## 2017-12-12 ENCOUNTER — Ambulatory Visit: Payer: Medicare Other

## 2017-12-13 ENCOUNTER — Ambulatory Visit: Payer: Medicare Other

## 2017-12-14 ENCOUNTER — Ambulatory Visit: Payer: Medicare Other

## 2017-12-15 ENCOUNTER — Ambulatory Visit: Payer: Medicare Other

## 2017-12-18 ENCOUNTER — Ambulatory Visit: Payer: Medicare Other

## 2017-12-19 ENCOUNTER — Ambulatory Visit: Payer: Medicare Other

## 2017-12-20 ENCOUNTER — Ambulatory Visit: Payer: Medicare Other

## 2017-12-20 NOTE — Progress Notes (Signed)
  Radiation Oncology         [phone] ________________________________  Name: Carol Powers MRN: [id number]  Date: 12/11/2017  DOB: 01/05/1941  End of Treatment Note  Diagnosis:   77 y.o. 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   Plan: The patient expired on 2017-12-26. ________________________________  Jodelle Gross, MD, PhD  This document serves as a record of services personally performed by Kyung Rudd, MD. It was created on his behalf by Rae Lips, a trained medical scribe. The creation of this record is based on the scribe's personal observations and the provider's statements to them. This document has been checked and approved by the attending provider.

## 2017-12-21 ENCOUNTER — Ambulatory Visit: Payer: Medicare Other

## 2017-12-25 NOTE — Progress Notes (Signed)
Hypoglycemic Event  CBG: 40  Treatment: Dextrose 50 = 1 amp  Given   Symptoms: Patient on ventilator   Follow-up CBG: Time:1619 CBG Result:81  Possible Reasons for Event: unknown   Comments/MD notified: Dr. [place] Bells notified by  Mendel Corning, RN  Atilano Median, Lester Mertens

## 2017-12-25 NOTE — Progress Notes (Signed)
PULMONARY / CRITICAL CARE MEDICINE   Name: Carol Powers MRN: [id number] DOB: 1941-01-06    ADMISSION DATE:  11/28/2017 CONSULTATION DATE:  12/24/2017  REFERRING MD:  Dr. Tyrone Nine  CHIEF COMPLAINT:  Hypotension  HISTORY OF PRESENT ILLNESS:   77 y/o former smoker admitted with RUL post obstructive pneumonia due to newly diagnosed non-small cell lung cancer.  She has required prolonged mechanical ventilation and has multi-organ failure.     SUBJECTIVE:  No acute events Minimal urine output with lasix Hypoxemia  VITAL SIGNS: BP (!) 98/23 (BP Location: Left Arm)   Pulse 84   Temp 99.5 F (37.5 C)   Resp (!) 24   Ht 5\' 1"  (1.549 m)   Wt 213 lb 13.5 oz (97 kg)   SpO2 93%   BMI 40.41 kg/m   HEMODYNAMICS: CVP:  [16 mmHg] 16 mmHg  VENTILATOR SETTINGS: Vent Mode: PRVC FiO2 (%):  [65 %-70 %] 70 % Set Rate:  [20 bmp-22 bmp] 22 bmp Vt Set:  [400 mL] 400 mL PEEP:  [10 cmH20] 10 cmH20 Plateau Pressure:  [25 CVE93-81 cmH20] 27 cmH20  INTAKE / OUTPUT: I/O last 3 completed shifts: In: 29 [I.V.:3905; Other:350; NG/GT:1256; IV Piggyback:212] Out: 845 [Urine:245; Stool:600]  General:  In bed on vent HENT: NCAT ETT in place PULM: CTA B, vent supported breathing CV: RRR, no mgr GI: BS+, soft, nontender MSK: normal bulk and tone Derm: significant anasarca Neuro: sedated on vent   LABS:  BMET Recent Labs  Lab 12/08/17 0459 12/08/17 1316 12/28/17 0018  NA 130* 129* 130*  K 4.0 3.2* 3.1*  CL 97* 95* 93*  CO2 18* 20* 22  BUN 62* 66* 76*  CREATININE 3.47* 3.50* 3.75*  GLUCOSE 206* 197* 146*    Electrolytes Recent Labs  Lab 12/05/17 0600  12/07/17 0531  12/08/17 0459 12/08/17 1316 12-28-2017 0018  CALCIUM 7.9*   < > 7.9*   < > 7.5* 7.1* 7.3*  MG 2.0  --  1.9  --  1.8  --   --   PHOS 2.9  --  5.7*  --  5.6*  --   --    < > = values in this interval not displayed.    CBC Recent Labs  Lab 12/07/17 0531 12/08/17 0459 12-28-17 0500  WBC 38.5* 45.0* 32.2*   HGB 7.6* 7.3* 6.7*  HCT 22.6* 21.7* 20.0*  PLT 224 228 222    Coag's No results for input(s): APTT, INR in the last 168 hours.  Sepsis Markers Recent Labs  Lab 12/04/17 0659 12/04/17 1110 12/06/17 0436 12/07/17 0531 12/08/17 0459  LATICACIDVEN 2.3* 1.8  --   --   --   PROCALCITON 8.62  --  7.84 8.16 7.28    ABG Recent Labs  Lab 12/08/17 0414 12/08/17 1337 12-28-2017 0302  PHART 7.196* 7.294* 7.331*  PCO2ART 47.3 40.6 43.3  PO2ART 54.1* 64.6* 62.4*    Liver Enzymes Recent Labs  Lab 12/05/17 0600 12/07/17 0531  AST 68* 55*  ALT 21 18  ALKPHOS 84 99  BILITOT 0.6 0.7  ALBUMIN 1.5* 1.4*    Cardiac Enzymes Recent Labs  Lab 12/04/17 0659 12/04/17 1110 12/04/17 1815  TROPONINI 0.04* 0.03* 0.04*    Glucose Recent Labs  Lab 12/08/17 1259 12/08/17 1620 12/08/17 2020 12/08/17 2348 December 28, 2017 0314 12-28-17 0756  GLUCAP 183* 171* 151* 151* 140* 126*    Imaging No results found.   STUDIES:  3/4  CT Chest >> 6.1 cm posterior RUL mass concerning  for primary bronchogenic neoplasm, mass extends to the right perihilar region and abuts the right major fissure.  Mediastinal & right hildar nodal metastases.  Bilateral adrenal masses measuring up to 5.2 cm on the R worrisome for metastatic disease.  Scattered hepatic lesions measuring up to 92mm, indeterminate but worrisome for metastases.   Renal US 3/6 >> normal renal US CT Head 3/6 >> mild chronic ischemic white matter disease. No acute abnormality.  Ct abd/pelvis 3/13: IMPRESSION: 1. Central mesenteric partially calcified mass is concerning for metastatic lesion. Lesion as typical appearance of carcinoid tumor however would favor lung carcinoma given biopsy-proven RIGHT upper lobe non-small cell lung cancer. 2. Bilateral large adrenal glands indeterminate and could represent metastatic lesions.3. LEFT retroperitoneal nodule. Recommend FDG PET scan for further evaluation4. Moderate volume of free fluid in the  abdomen and pelvis.5. New bilateral pleural effusions and basilar atelectasis. RIGHT upper lobe mass is not imaged on current study.   CULTURES: BCx2 3/6 >> ng BC 3/10 MRSE 2/2  UA 3/6 >> concern for UTI  UC 3/6 >> greater 100k E-Coli >> pan sensitive   ANTIBIOTICS: Vanco 3/6 x1 Zosyn 3/6 x1 Rocephin (UTI) 3/7 >> 3/10 Azithro (L infiltrate) 3/7 >> 3/10 Zosyn 3/10 >> vanc 3/10 >>3/15  SIGNIFICANT EVENTS: 3/06  Admit, initially improved with IVF > decompensated late PM / intubated  3/07  Levophed 22 mcg, Vasopressin. CVP 10 3/8 Off pressors, extubated. 3/11 Worsened overnight with HFnC, then altered mental status this am requiring re-intubation by anesthesia 3/14 bronchoscopy #2, RUL mass biopsied   LINES/TUBES: ETT 3/6 >> 3/8, 3/11  >> Rt femoral TLC 3/7 >>3/9 A line 3/7 >>3/9 RIJ 3/11 >>   DISCUSSION: 77 y/o female with acute respiratory failure with hypoxemia due to non-small cell lung cancer causing RUL pneumonia.  ASSESSMENT / PLAN:  PULMONARY A: Acute respiratory failure with hypoxemia RUL pneumonia RUL mass P:   Full mechanical vent support VAP prevention Daily WUA/SBT Continue high level vent (PEEP, FiO2) wean as tolerated Hold XRT today given profound hypoxemia  CARDIOVASCULAR A:  Septic shock resolved P:  Tele Continue to monitor hemodynamics  RENAL A:   Oliguric renal failure P:   Monitor BMET and UOP Replace electrolytes as needed Discontinue bicarbonate drip Continue lasix Not a candidate for hemodialysis  GASTROINTESTINAL A:   Protein calorie malnutrition P:   Tube feeding Stress ulcer prophylaxis  HEMATOLOGIC A:   Anemia without bleeding P:  Monitor for bleeding Transfuse 1 U PRBC  INFECTIOUS A:   RUL pneumonia Coag negative staph positive blood culture> contaminant E coli UTI resolved P:   Monitor cultures Continue zosyn for now (day 10, consider stopping if WBC improved 3/16)  ENDOCRINE A:   Hyperglycemia   Hypothyroidism P:   SSI Continue synthroid  NEUROLOGIC A:   Sedation needs for vent synchrony P:   RASS goal:0 PAD protocol: prn fentanyl   FAMILY  - Updates: updated the daughter bedside on 3/16, I expressed my concern she will not survive this  - Inter-disciplinary family meet or Palliative Care meeting due by:  Day 7   My cc time 36 minutes  Carol Awkward, MD [place] [facility] Pager: [phone] Cell: [phone] After 3pm or if no response, call [phone]  2017-12-27, 8:15 AM

## 2017-12-25 NOTE — Progress Notes (Signed)
Notified by Mendel Corning RN in regards to patient being pronounced deceased at 70 but when some time after appeared to have carotid pulse and spontaneous breathing around the ETT. Came to bedside explained to family the situation and this does occur sometimes. Explained also to family that the patient is passing and at this time to prevent patient from suffering, that ETT needs to be removed and allow the patient to pass in peace. Patient husband and daughter at bedside made decision to terminally extubate patient. This was witnessed by Mendel Corning RN and Cristela Felt RRT. Multiple family members at bedside.

## 2017-12-25 NOTE — Progress Notes (Signed)
[place] Progress Note Patient Name: Carol Powers DOB: 01-19-1941 MRN: [id number]   Date of Service  2017-12-11  HPI/Events of Note  hypokalemia  eICU Interventions  Potassium replaced     Intervention Category Intermediate Interventions: Electrolyte abnormality - evaluation and management  DETERDING,ELIZABETH 2017/12/11, 6:08 AM

## 2017-12-25 NOTE — Progress Notes (Signed)
[place] Progress Note Patient Name: PEDRO OLDENBURG DOB: 11-May-1941 MRN: [id number]   Date of Service  2017-12-22  HPI/Events of Note  Hgb drop from 7.3 to 6.7  On subq heparin  eICU Interventions  Transfuse 1 unit pRBC Continue with subq heparin for now but consider d/c if Hgb does not increase with txf     Intervention Category Intermediate Interventions: Other:  DETERDING,ELIZABETH 12-22-2017, 6:13 AM

## 2017-12-25 NOTE — Procedures (Signed)
Extubation Procedure Note  Patient Details:   Name: Carol Powers DOB: 1941/04/19 MRN: [id number]   Airway Documentation:  Airway 7 mm (Active)  Secured at (cm) 22 cm 12-17-17 11:09 AM  Measured From Lips 12-17-17 11:09 AM  Secured Location Right December 17, 2017 11:09 AM  Secured By [employer] Dec 17, 2017 11:09 AM  Tube Holder Repositioned Yes 12-17-17 11:09 AM  Cuff Pressure (cm H2O) 28 cm H2O 12/08/2017  4:08 PM  Site Condition Dry 2017/12/17 11:09 AM    Evaluation  O2 sats: currently acceptable Complications: No apparent complications Patient did tolerate procedure well. Bilateral Breath Sounds: Rhonchi, Expiratory wheezes, Coarse crackles   No    Patient withdrawn from vent for comfort/death. Patient family at bedside. RT available as needed.   Lamonte Sakai December 17, 2017, 5:36 PM

## 2017-12-25 NOTE — Death Summary Note (Signed)
DEATH SUMMARY   Patient Details  Name: Carol Powers MRN: [id number] DOB: [birth year]  Admission/Discharge Information   Admit Date:  Dec 11, 2017  Date of Death: Date of Death: Dec 21, 2017  Time of Death: Time of Death: 11-18-1739  Length of Stay: November 17, 2022  Referring Physician: Lavone Orn, MD   Reason(s) for Hospitalization  Shortness of breath Diagnoses  Preliminary cause of death:  Secondary Diagnoses (including complications and co-morbidities):  Principal Problem:   Postobstructive pneumonia Active Problems:   Hypotension   Sepsis ([place])   Leukocytosis   Malignant neoplasm of upper lobe of right lung ([place])   Right adrenal mass ([place])   Acute respiratory failure [facility])   Brief Hospital Course (including significant findings, care, treatment, and services provided and events leading to death)  Carol Powers is a 77 y.o. year old female who was admitted for shortness of breath.  She was found to have pneumonia and a lung mass.  She required intubation for mechanical ventilation due to respiratory failure.  A bronchsocopy was performed and she was diagnosis with non-small cell lung cancer.  Attempts were made at Radiation therapy but her condition worsened.  Given the metastatic, incurable nature of the disease and her worsening multi-organ failure we discussed prognosis with the patient's family.  After multiple discussions they elected to make her code status DNR and not escalate care.   Pertinent Labs and Studies  Significant Diagnostic Studies Ct Abdomen Pelvis Wo Contrast  Result Date: 12/06/2017 CLINICAL DATA:  Lung cancer.  Recent diagnosis. EXAM: CT ABDOMEN AND PELVIS WITHOUT CONTRAST TECHNIQUE: Multidetector CT imaging of the abdomen and pelvis was performed following the standard protocol without IV contrast. COMPARISON:  Chest CT 11/27/2017 FINDINGS: Lower chest: New bilateral pleural effusion associated basilar atelectasis. The RIGHT upper lobe mass is not imaged.  Hepatobiliary: No focal hepatic lesion on noncontrast exam. Postcholecystectomy. Pancreas: Pancreas is normal. No ductal dilatation. No pancreatic inflammation. Spleen: Bilateral enlargement of adrenal glands to 2.5 cm on RIGHT and 1.9 cm on LEFT. Kidneys normal. Ureters and bladder grossly normal Foley catheter within collapsed bladder Adrenals/urinary tract: Adrenal glands and kidneys are normal. The ureters and bladder normal. Stomach/Bowel: NG tube in stomach. Mass within the central mesentery with calcifications. Mass is irregular measuring approximately 6.3 by 3.8 cm (image 48, series 2). There is fluid within the leaves of the mesentery. No bowel obstruction. Post appendectomy. Fluid stool in the colon. Vascular/Lymphatic: Abdominal aorta is normal caliber. No periportal or retroperitoneal adenopathy. No pelvic adenopathy. Reproductive: Uterus and ovaries normal Other: Nodule in the LEFT retroperitoneum adjacent to the psoas muscle measures 13 mm (image 50, series 2. Musculoskeletal: No aggressive osseous lesion. IMPRESSION: 1. Central mesenteric partially calcified mass is concerning for metastatic lesion. Lesion as typical appearance of carcinoid tumor however would favor lung carcinoma given biopsy-proven RIGHT upper lobe non-small cell lung cancer. 2. Bilateral large adrenal glands indeterminate and could represent metastatic lesions. 3. LEFT retroperitoneal nodule. Recommend FDG PET scan for further evaluation 4. Moderate volume of free fluid in the abdomen and pelvis. 5. New bilateral pleural effusions and basilar atelectasis. RIGHT upper lobe mass is not imaged on current study. Electronically Signed   By: Suzy Bouchard M.D.   On: 12/06/2017 18:39   Dg Chest 1 View  Result Date: 12/04/2017 CLINICAL DATA:  77 year old female with endotracheal tube placement. Subsequent encounter. EXAM: CHEST 1 VIEW COMPARISON:  12/04/2017 6:07 a.m. chest x-ray.  11/27/2017 chest CT. FINDINGS: Endotracheal tube  tip 2.5 cm above the  carina. Nasogastric tube courses below the diaphragm. Tip is not included on the present exam. Right hilar/upper lobe mass with obstruction and atelectasis right upper lobe. Bilateral pleural effusions with basilar consolidation suspicious for atelectasis with infiltrate less likely consideration. Stable mild central pulmonary vascular prominence. IMPRESSION: Endotracheal tube tip 2.5 cm above the carina. Right hilar/upper lobe mass with obstruction and atelectasis of the right upper lobe. Bilateral pleural effusions with basilar consolidation suspicious for atelectasis with infiltrate less likely consideration. Electronically Signed   By: Genia Del M.D.   On: 12/04/2017 08:31   Dg Chest 1 View  Result Date: 11/30/2017 CLINICAL DATA:  Pneumothorax.  Lung mass.  Intubated. EXAM: CHEST 1 VIEW COMPARISON:  Radiograph yesterday at 2038 hour.  CT 11/27/2017 FINDINGS: Endotracheal tube tip at the thoracic inlet. Tip and side port of the enteric tube below the diaphragm in the stomach. No visualized pneumothorax. Right suprahilar lung mass is unchanged from prior exam. Unchanged heart size and mediastinal contours. Developing left lung base opacity likely atelectasis and/or small effusion. IMPRESSION: 1. No evidence of pneumothorax. 2. Developing left lung base opacity may be combination of atelectasis and pleural fluid. 3. Right upper lobe lung mass unchanged from prior exams. Electronically Signed   By: Jeb Levering M.D.   On: 11/30/2017 03:57   Dg Chest 2 View  Result Date: 11/28/2017 CLINICAL DATA:  The acute presentation with unresponsiveness. New diagnosis of lung cancer. EXAM: CHEST - 2 VIEW COMPARISON:  11/23/2017 FINDINGS: Heart size is normal. Right suprahilar mass is redemonstrated. Slight worsening of volume loss and/or infiltrate in the right upper lobe and left lower lobe. No sign of heart failure. IMPRESSION: Redemonstration of right suprahilar mass. Slight worsening of  density in the right upper lobe and left lower lobe that could be atelectasis or mild pneumonia. Electronically Signed   By: Nelson Chimes M.D.   On: 12/22/2017 12:07   Dg Chest 2 View  Result Date: 11/23/2017 CLINICAL DATA:  Cough and congestion EXAM: CHEST  2 VIEW COMPARISON:  Feb 13, 2012 FINDINGS: There is an apparent mass in the posterior segment of the right upper lobe with surrounding atelectatic change measuring 5.1 x 3.2 cm. It is less well delineated on the lateral view. Lungs elsewhere are clear. Heart size and pulmonary vascularity are normal. No adenopathy. No bone lesions. IMPRESSION: Apparent mass posterior segment right upper lobe medially. Neoplasm suspected. Advise contrast enhanced chest CT to further evaluate. Lungs elsewhere clear.  No adenopathy appreciable. These results will be called to the ordering clinician or representative by the Radiologist Assistant, and communication documented in the PACS or zVision Dashboard. Electronically Signed   By: Lowella Grip III M.D.   On: 11/23/2017 11:38   Dg Abd 1 View  Result Date: 12/04/2017 CLINICAL DATA:  77 year old female with gastric tube placement. Subsequent encounter. EXAM: ABDOMEN - 1 VIEW COMPARISON:  11/30/2017. FINDINGS: Nasogastric tube tip gastric body level. Side hole fundus-body junction. Nonspecific calcifications mid aspect left abdomen just left of midline possibly pancreatic in origin. Post cholecystectomy. No bowel obstruction. The possibility of free intraperitoneal air cannot be assessed on a supine view. IMPRESSION: Nasogastric tube tip gastric body level. Side hole fundus-body junction. No evidence of bowel obstruction. Nonspecific calcifications mid aspect left abdomen just left of midline possibly pancreatic in origin. Electronically Signed   By: Genia Del M.D.   On: 12/04/2017 08:34   Dg Abdomen 1 View  Result Date: 12/07/2017 CLINICAL DATA:  OG tube placement. EXAM: ABDOMEN -  1 VIEW COMPARISON:  None.  FINDINGS: Tip and side port of the enteric tube below the diaphragm in the stomach. Cholecystectomy clips in the right upper quadrant. Probable ingested material within bowel loops in the central abdomen. IMPRESSION: Tip and side port of the enteric tube below the diaphragm in the stomach. Electronically Signed   By: Jeb Levering M.D.   On: 12/10/2017 22:02   Ct Head Wo Contrast  Result Date: 12/10/2017 CLINICAL DATA:  Altered level of consciousness. EXAM: CT HEAD WITHOUT CONTRAST TECHNIQUE: Contiguous axial images were obtained from the base of the skull through the vertex without intravenous contrast. COMPARISON:  None. FINDINGS: Brain: Mild chronic ischemic white matter disease is noted. No mass effect or midline shift is noted. Ventricular size is within normal limits. There is no evidence of mass lesion, hemorrhage or acute infarction. Vascular: No hyperdense vessel or unexpected calcification. Skull: Normal. Negative for fracture or focal lesion. Sinuses/Orbits: No acute finding. Other: None. IMPRESSION: Mild chronic ischemic white matter disease. No acute intracranial abnormality seen. Electronically Signed   By: Marijo Conception, M.D.   On: 12/18/2017 13:55   Ct Chest W Contrast  Result Date: 11/27/2017 CLINICAL DATA:  Right lung mass on chest radiograph EXAM: CT CHEST WITH CONTRAST TECHNIQUE: Multidetector CT imaging of the chest was performed during intravenous contrast administration. CONTRAST:  75 mL Isovue 300 IV COMPARISON:  Chest radiograph dated 11/15/2017. CT chest dated 03/13/2008. FINDINGS: Cardiovascular: Heart is normal in size.  No pericardial effusion. No evidence of thoracic aortic aneurysm. Mediastinum/Nodes: 11 mm short axis right hilar node (series 8/image 54). 10 mm short axis subcarinal node (series 8/image 54). 10 mm short axis low right paratracheal node (series 8/image 44). Visualized left thyroid is unremarkable. Lungs/Pleura: 6.1 x 6.0 x 5.8 cm irregular posterior right  upper lobe mass extending into the right perihilar region, narrowing right upper lobe bronchi, compatible with primary bronchogenic neoplasm. Mass abuts the right major fissure. Three pleural-based nodules in the right upper lobe measuring up to 4 mm (series 14/images 28, 33, and 52), nonspecific. Mild linear scarring/atelectasis in the lateral left lower lobe. No focal consolidation. Mild scarring/atelectasis at the right lung base. No pleural effusion or pneumothorax. Upper Abdomen: Bilateral adrenal masses, measuring 5.2 x 1.9 cm on the right and 2.8 x 1.5 cm on the left, indeterminate but worrisome for metastatic disease. These are new from 2009. Multiple scattered hypoenhancing hepatic lesions measuring up to 11 mm in segment 5 (series 8/image 139), worrisome for metastases, although incompletely visualized. Musculoskeletal: Degenerative changes of the visualized thoracolumbar spine. IMPRESSION: 6.1 cm posterior right upper lobe mass, compatible with primary bronchogenic neoplasm, as above. Mass extends to the right perihilar region and abuts the right major fissure. Mediastinal and right hilar nodal metastases. Bilateral adrenal masses, measuring up to 5.2 cm on the right, worrisome for metastatic disease. Scattered hepatic lesions measuring up to 11 mm, indeterminate but worrisome for metastases. Consider PET-CT for complete staging as clinically warranted. Electronically Signed   By: Julian Hy M.D.   On: 11/27/2017 13:34   US Renal  Result Date: 12/14/2017 CLINICAL DATA:  77 y/o  F; acute kidney injury. EXAM: RENAL / URINARY TRACT ULTRASOUND COMPLETE COMPARISON:  None. FINDINGS: Right Kidney: Length: 10.5 cm. Echogenicity within normal limits. No mass or hydronephrosis visualized. Left Kidney: Length: 10.2 cm. Echogenicity within normal limits. No mass or hydronephrosis visualized. Bladder: Appears normal for degree of bladder distention. IMPRESSION: Normal renal ultrasound. Electronically Signed  By: Kristine Garbe M.D.   On: 12/12/2017 19:19   Dg Chest Port 1 View  Result Date: 12-24-17 CLINICAL DATA:  Acute respiratory failure. EXAM: PORTABLE CHEST 1 VIEW COMPARISON:  Chest radiograph 12/05/2017. FINDINGS: ET tube terminates in the mid trachea. Enteric tube courses inferior to the diaphragm. Central venous catheter tip projects over the superior vena cava. Stable cardiomegaly. Monitoring leads overlie the patient. Read demonstrated right upper lobe mass. Layering bilateral pleural effusions and underlying opacities, right greater than left. IMPRESSION: ET tube terminates in the mid trachea. Layering bilateral effusions, right-greater-than-left. Right upper lobe mass. Electronically Signed   By: Lovey Newcomer M.D.   On: 24-Dec-2017 11:07   Portable Chest Xray  Result Date: 12/05/2017 CLINICAL DATA:  Intubation. EXAM: PORTABLE CHEST 1 VIEW COMPARISON:  12/04/2017. FINDINGS: Endotracheal tube tip 1.1 cm above the carina. Proximal repositioning of approximately 2 cm should be considered. Right IJ line in stable position. NG tube in stable position. Heart size stable. Right hilar mass with persistent atelectasis right upper lung again noted. Small bilateral pleural effusions noted. No pneumothorax. IMPRESSION: 1. Endotracheal tube tip 1.1 cm above the carina. Proximal repositioning of approximately 2 cm should be considered. Right IJ line and NG tube in stable position. 2. Right hilar mass with atelectasis of the right upper lung again noted. Bilateral pleural effusions again noted. Electronically Signed   By: Marcello Moores  Register   On: 12/05/2017 09:04   Portable Chest X-ray  Result Date: 12/04/2017 CLINICAL DATA:  77 year old female with right perihilar lung mass, shortness of breath, intubated and enteric tube placed. Central line placement. EXAM: PORTABLE CHEST 1 VIEW COMPARISON:  0803 hours today and earlier. FINDINGS: Portable AP semi upright view at 0846 hours. Right IJ approach central  line placed. Catheter tip projects about 1 centimeter below the expected cavoatrial junction level. No pneumothorax. Continued right upper lobe collapse and/or consolidation now obscuring the right hilar and paratracheal masslike opacity seen in February. Stable endotracheal tube and visible enteric tube. Small bilateral pleural effusions are suspected and stable. Other mediastinal contours are stable. Stable cholecystectomy clips. Paucity bowel gas in the upper abdomen. IMPRESSION: 1. Right IJ central line placed, the tip projects about 1 cm below the cavoatrial junction level. No pneumothorax. 2. Right hilar lung mass with right upper collapse and/or consolidation. 3. Small bilateral pleural effusions. No new cardiopulmonary abnormality. Electronically Signed   By: Genevie Ann M.D.   On: 12/04/2017 09:12   Dg Chest Port 1 View  Result Date: 12/04/2017 CLINICAL DATA:  Acute onset of shortness of breath. EXAM: PORTABLE CHEST 1 VIEW COMPARISON:  Chest radiograph performed 12/03/2017 FINDINGS: New right upper lobe and worsening left basilar airspace opacities are compatible with worsening pneumonia. A small left pleural effusion is suspected. No pneumothorax is seen. The cardiomediastinal silhouette is borderline normal in size. No acute osseous abnormalities are identified. IMPRESSION: Worsening bilateral pneumonia noted. Suspect small left pleural effusion. Followup PA and lateral chest X-ray is recommended in 3-4 weeks following trial of antibiotic therapy to ensure resolution and exclude underlying malignancy. Electronically Signed   By: Garald Balding M.D.   On: 12/04/2017 06:24   Dg Chest Port 1 View  Result Date: 12/03/2017 CLINICAL DATA:  Acute respiratory failure. EXAM: PORTABLE CHEST 1 VIEW COMPARISON:  One day prior FINDINGS: Patient rotated right. Midline trachea. Normal heart size. Right paratracheal soft tissue fullness is similar. Left larger than right bilateral pleural effusions are small and  similar. No pneumothorax. Bibasilar and right  upper lobe airspace disease is not significantly changed. IMPRESSION: No significant change since one day prior. Small bilateral pleural effusions and bibasilar airspace disease. Right upper lobe lung mass and right paratracheal adenopathy, as before. Electronically Signed   By: Abigail Miyamoto M.D.   On: 12/03/2017 07:11   Dg Chest Port 1 View  Result Date: 12/02/2017 CLINICAL DATA:  Acute respiratory failure. EXAM: PORTABLE CHEST 1 VIEW COMPARISON:  12/01/2017 FINDINGS: Endotracheal and enteric tubes have been removed. Abnormal right paratracheal soft tissue density corresponds to known upper lobe mass and lymphadenopathy. There are patchy left greater than right airspace opacities in the lung bases which have significantly improved from prior. Small bilateral pleural effusions also may have mildly decreased in size. Right upper lobe aeration also appear slightly improved. No pneumothorax is identified. IMPRESSION: 1. Interval extubation. Improved lung aeration with residual left greater than right basilar atelectasis or infiltrates. 2. Small bilateral pleural effusions. 3. Known right upper lobe mass and lymphadenopathy. Electronically Signed   By: Logan Bores M.D.   On: 12/02/2017 07:01   Dg Chest Port 1 View  Result Date: 12/01/2017 CLINICAL DATA:  Acute respiratory failure with hypoxia EXAM: PORTABLE CHEST 1 VIEW COMPARISON:  12/01/2007 FINDINGS: Endotracheal tube in good position. Gastric tube enters the stomach with the tip not visualized Right upper lobe mass lesion and paratracheal adenopathy unchanged. Progressive bibasilar airspace disease. Progression of bilateral pleural effusions. Progression of right upper lobe airspace disease. IMPRESSION: Progression of bilateral airspace disease and small pleural effusions. Possible edema or pneumonia Right upper lobe mass lesion Electronically Signed   By: Franchot Gallo M.D.   On: 12/01/2017 07:02   Dg Chest  Port 1 View  Result Date: 12/20/2017 CLINICAL DATA:  Status post intubation EXAM: PORTABLE CHEST 1 VIEW COMPARISON:  12/13/2017 FINDINGS: Cardiac shadow is stable. Endotracheal tube and nasogastric catheter are noted in satisfactory position. Persistent right upper lobe mass lesion is again seen and stable. The overall inspiratory effort is poor. No bony abnormality is seen. IMPRESSION: Tubes and lines as described above. Stable right upper lobe mass. Electronically Signed   By: Inez Catalina M.D.   On: 11/24/2017 22:00    Microbiology Recent Results (from the past 240 hour(s))  Acid Fast Smear (AFB)     Status: None   Collection Time: 12/04/17 10:15 AM  Result Value Ref Range Status   AFB Specimen Processing Concentration  Final   Acid Fast Smear Negative  Final    Comment: (NOTE) Performed At: [facility] [address] [place], [place] [id number] Rush Farmer MD [id number]    Source (AFB) BRONCHIAL ALVEOLAR LAVAGE  Final    Comment: Performed at [facility], [place] [address], [place], [place] [address]  Fungus Culture With Stain     Status: None (Preliminary result)   Collection Time: 12/04/17 10:15 AM  Result Value Ref Range Status   Fungus Stain Final report  Final    Comment: (NOTE) Performed At: [facility] [place], [place] [id number] Rush Farmer MD [id number]    Fungus (Mycology) Culture PENDING  Incomplete   Fungal Source BRONCHIAL ALVEOLAR LAVAGE  Final    Comment: Performed at [facility], [place] [address] [place], [place] [address]  Fungus Culture Result     Status: None   Collection Time: 12/04/17 10:15 AM  Result Value Ref Range Status   Result 1 Yeast observed  Final    Comment: (NOTE) Performed At: [place]  [place], [place] [id number] Rush Farmer MD [id number] Performed at [facility], [place] [address], [place], [place] [address]    Culture, bal-quantitative     Status: Abnormal   Collection Time: 12/04/17 10:34 AM  Result Value Ref Range Status   Specimen Description   Final    BRONCHIAL ALVEOLAR LAVAGE Performed at [place] [address] [place], [place] [address]    Special Requests   Final    NONE Performed at [facility], [place] [address], [place], [place] [address]    Gram Stain   Final    MODERATE WBC PRESENT, PREDOMINANTLY PMN NO SQUAMOUS EPITHELIAL CELLS SEEN NO ORGANISMS SEEN Performed at [facility], [place] [address] [place], [place] [address]    Culture (A)  Final    10,000 COLONIES/mL CANDIDA ALBICANS 10,000 COLONIES/mL CANDIDA TROPICALIS    Report Status 12/08/2017 FINAL  Final  C difficile quick scan w PCR reflex     Status: None   Collection Time: 12/08/17  9:44 AM  Result Value Ref Range Status   C Diff antigen NEGATIVE NEGATIVE Final   C Diff toxin NEGATIVE NEGATIVE Final   C Diff interpretation No C. difficile detected.  Final    Comment: Performed at [facility], [place] [address] [place], [place] [address]    Lab Basic Metabolic Panel: Recent Labs  Lab 12/07/17 0531 12/07/17 2141 12/08/17 0459 12/08/17 1316 2017/12/22 0018 12/22/2017 1254  NA 131* 130* 130* 129* 130* 130*  K 4.3 4.2 4.0 3.2* 3.1* 3.4*  CL 102 99* 97* 95* 93* 92*  CO2 18* 17* 18* 20* 22 23  GLUCOSE 114* 188* 206* 197* 146* 103*  BUN 53* 59* 62* 66* 76* 84*  CREATININE 2.95* 3.45* 3.47* 3.50* 3.75* 4.11*  CALCIUM 7.9* 7.7* 7.5* 7.1* 7.3* 7.3*  MG 1.9  --  1.8  --   --   --   PHOS 5.7*  --  5.6*  --   --   --    Liver Function Tests: Recent Labs  Lab 12/07/17 0531  AST 55*  ALT 18  ALKPHOS 99  BILITOT 0.7  PROT 4.4*  ALBUMIN 1.4*   No results for input(s): LIPASE, AMYLASE in the last 168 hours. No results for input(s): AMMONIA in the last 168 hours. CBC: Recent Labs  Lab 12/07/17 0531 12/08/17 0459 Dec 22, 2017 0500  WBC 38.5*  45.0* 32.2*  HGB 7.6* 7.3* 6.7*  HCT 22.6* 21.7* 20.0*  MCV 85.3 83.8 83.3  PLT 224 228 222   Cardiac Enzymes: No results for input(s): CKTOTAL, CKMB, CKMBINDEX, TROPONINI in the last 168 hours. Sepsis Labs: Recent Labs  Lab 12/07/17 0531 12/08/17 0459 December 22, 2017 0500  PROCALCITON 8.16 7.28  --   WBC 38.5* 45.0* 32.2*    Procedures/Operations     Simonne Maffucci 12/13/2017, 1:48 PM

## 2017-12-25 NOTE — Progress Notes (Signed)
Pt in asystole. 2nd RN notified to verify time of death. No heart sounds auscultated. No pulses palpable. Time of death ruled at 74. Respiratory therapist called to remove ETT. Spontaneous breathing occurred. Carotid pulse palpable. Pt went into PEA on monitor. Official time of death 45. Family at bedside. MD made aware.

## 2017-12-25 DEATH — deceased

## 2018-01-03 LAB — FUNGUS CULTURE WITH STAIN

## 2018-01-03 LAB — FUNGUS CULTURE RESULT

## 2018-01-03 LAB — FUNGAL ORGANISM REFLEX

## 2018-01-16 LAB — ACID FAST CULTURE WITH REFLEXED SENSITIVITIES (MYCOBACTERIA): Acid Fast Culture: NEGATIVE

## 2018-01-16 LAB — ACID FAST CULTURE WITH REFLEXED SENSITIVITIES

## 2018-08-30 LAB — BLOOD GAS, ARTERIAL
Acid-base deficit: 3 mmol/L — ABNORMAL HIGH (ref 0.0–2.0)
Bicarbonate: 23.2 mmol/L (ref 20.0–28.0)
Drawn by: 514251
FIO2: 60
O2 Saturation: 87.5 %
PH ART: 7.275 — AB (ref 7.350–7.450)
PO2 ART: 59 mmHg — AB (ref 83.0–108.0)
Patient temperature: 98.6
pCO2 arterial: 51.7 mmHg — ABNORMAL HIGH (ref 32.0–48.0)
